# Patient Record
Sex: Male | Born: 2000 | Race: White | Hispanic: No | Marital: Single | Smoking: Never smoker
Health system: Southern US, Community
[De-identification: ages and names within clinical notes are randomized; demographics above are authoritative.]

## PROBLEM LIST (undated history)

## (undated) DIAGNOSIS — F419 Anxiety disorder, unspecified: Secondary | ICD-10-CM

## (undated) DIAGNOSIS — J050 Acute obstructive laryngitis [croup]: Secondary | ICD-10-CM

## (undated) DIAGNOSIS — G43909 Migraine, unspecified, not intractable, without status migrainosus: Secondary | ICD-10-CM

## (undated) DIAGNOSIS — J45909 Unspecified asthma, uncomplicated: Secondary | ICD-10-CM

## (undated) HISTORY — DX: Unspecified asthma, uncomplicated: J45.909

## (undated) HISTORY — DX: Migraine, unspecified, not intractable, without status migrainosus: G43.909

## (undated) HISTORY — DX: Acute obstructive laryngitis (croup): J05.0

---
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
## 2020-06-07 IMAGING — DX DG LUMBAR SPINE COMPLETE 4+V
5 series · 5 of 5 positions shown · non-contrast
Comparison: None.

CLINICAL DATA: Back pain for 1 year

EXAM:
LUMBAR SPINE - COMPLETE 4+ VIEW

[l-spine ap]
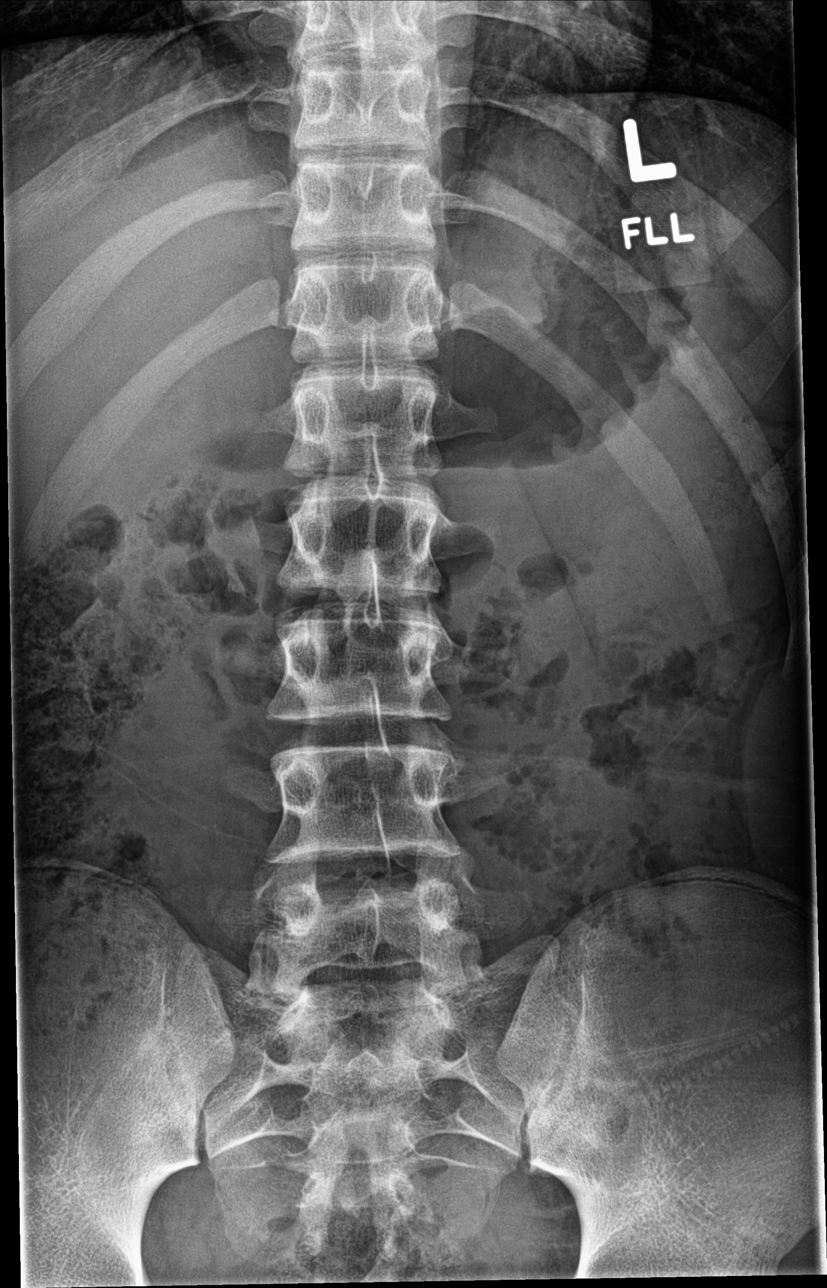

[l-spine obl (1 of 2)]
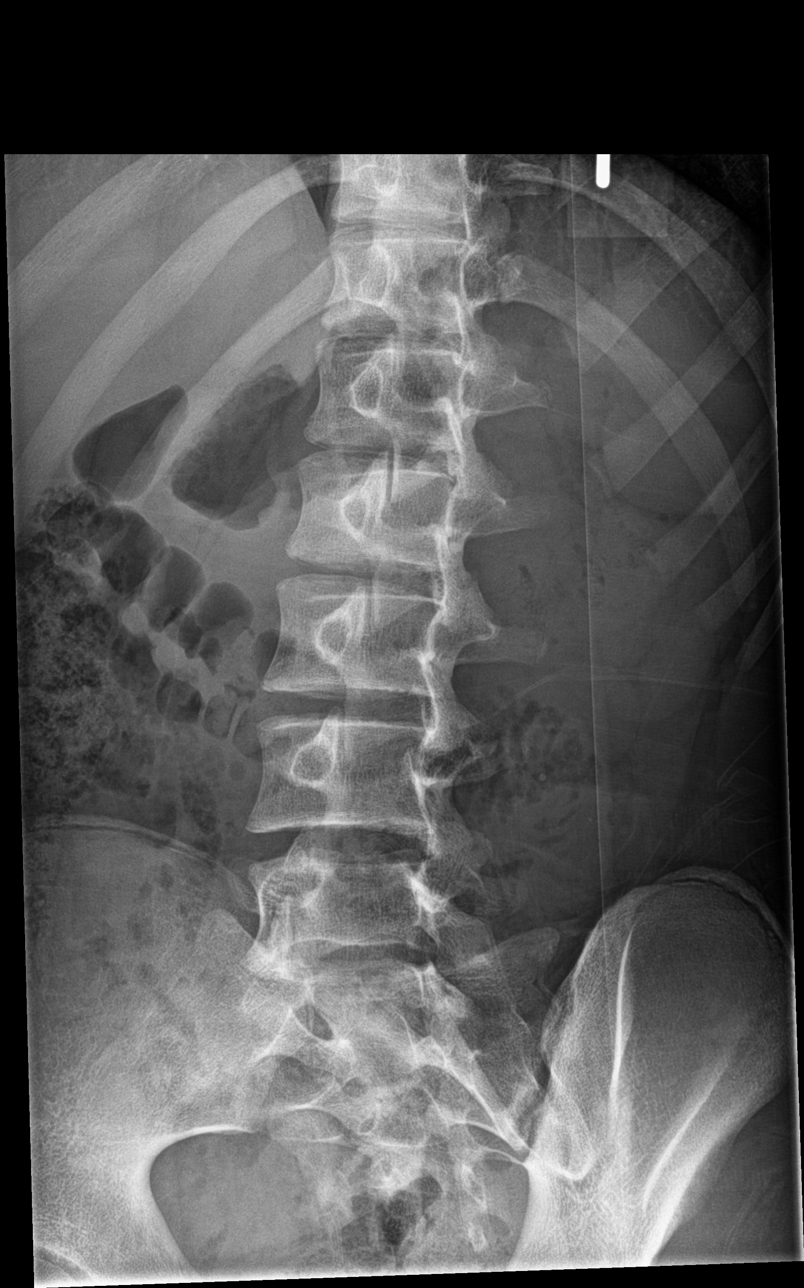

[l-spine obl (2 of 2)]
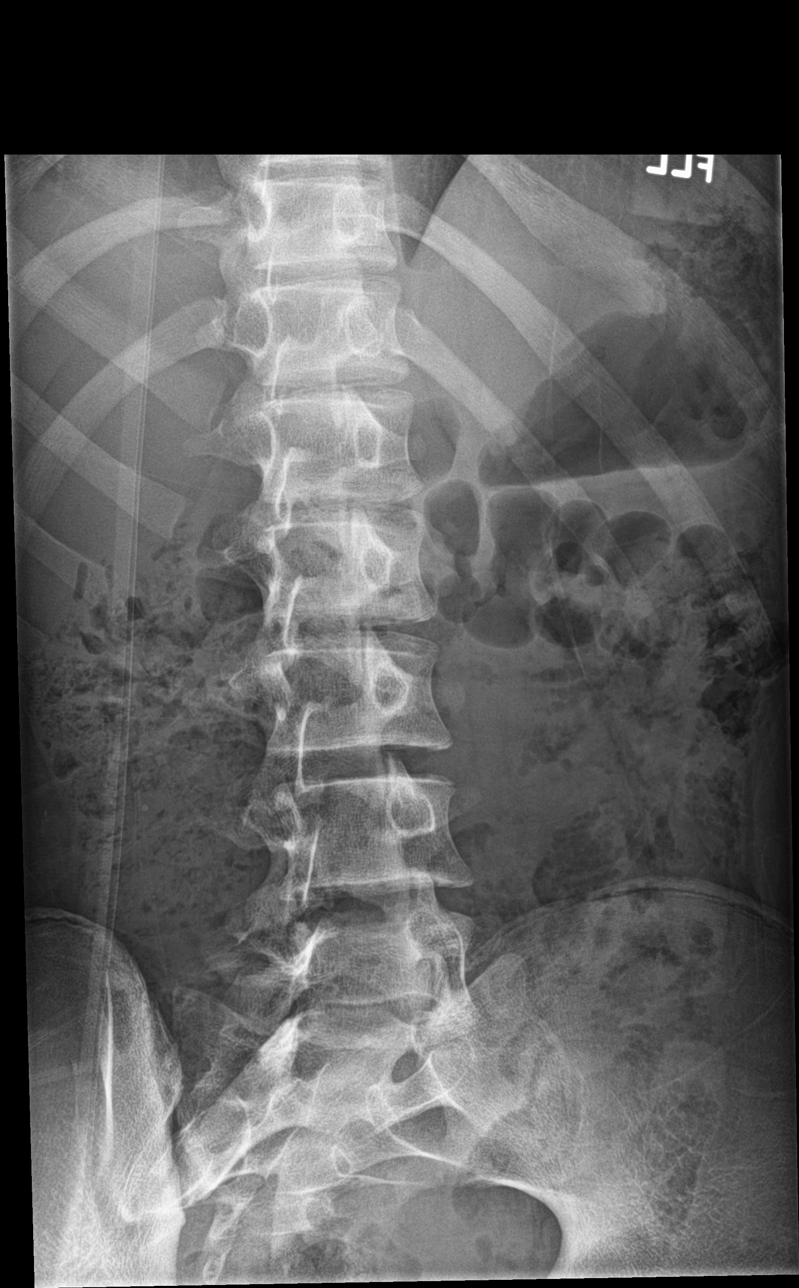

[l-spine lat]
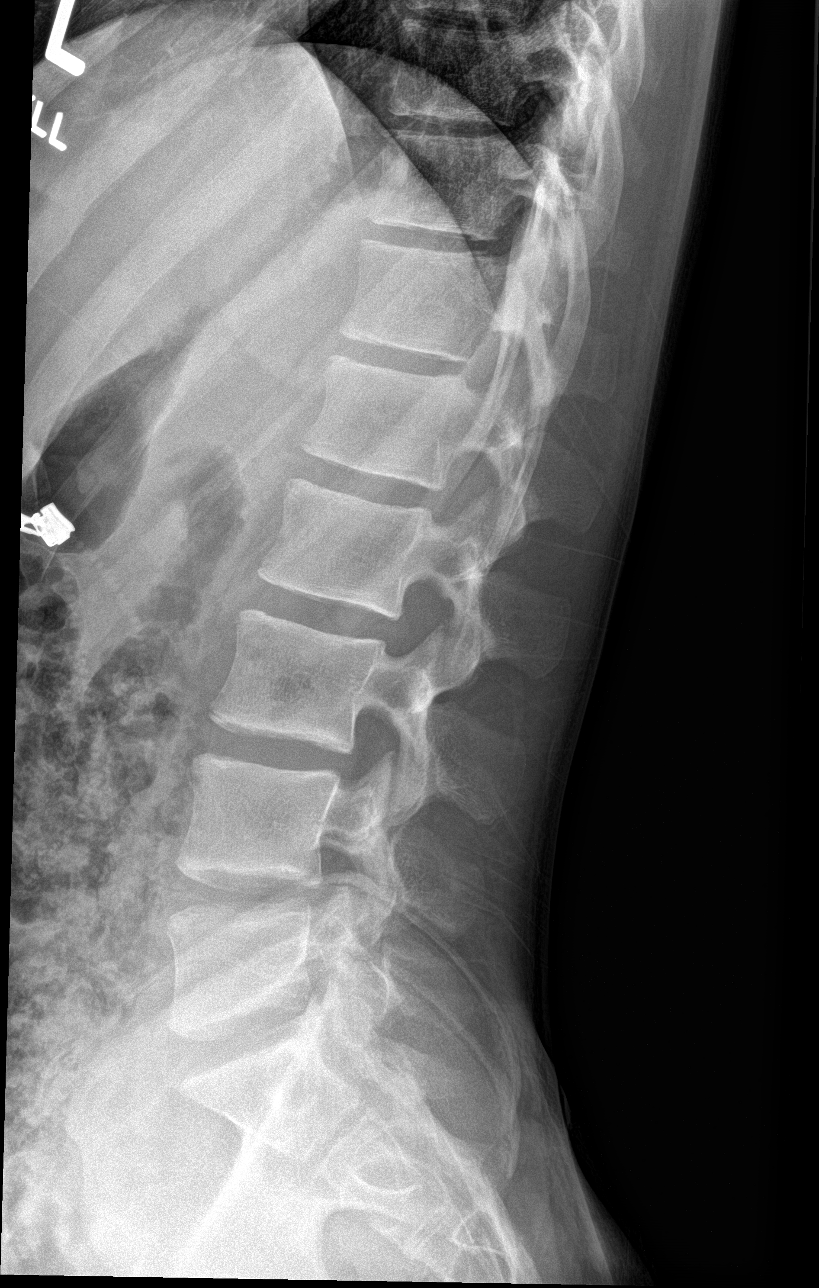

[l-spine spot]
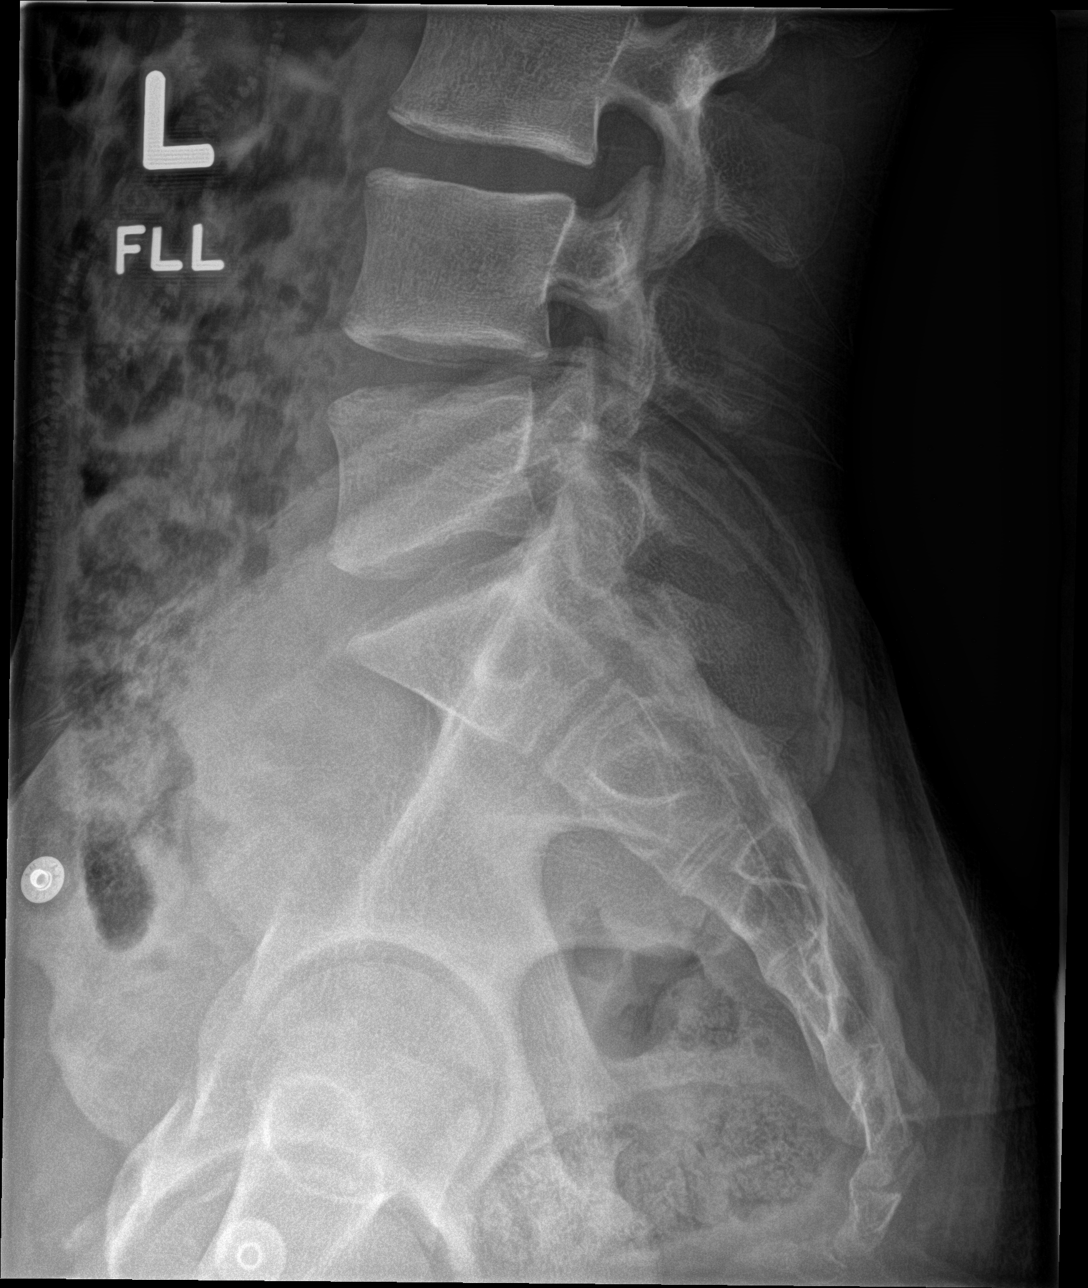

[5 of 5 positions shown; findings below may reference images not displayed]

FINDINGS: There is no evidence of lumbar spine fracture. Alignment is normal.
Intervertebral disc spaces are maintained.
IMPRESSION: Negative.

## 2020-07-30 ENCOUNTER — Ambulatory Visit: Payer: Self-pay | Admitting: Family Medicine

## 2020-07-30 ENCOUNTER — Other Ambulatory Visit: Payer: Self-pay

## 2020-07-30 ENCOUNTER — Ambulatory Visit (INDEPENDENT_AMBULATORY_CARE_PROVIDER_SITE_OTHER): Payer: BC Managed Care – PPO | Admitting: Family Medicine

## 2020-07-30 ENCOUNTER — Encounter: Payer: Self-pay | Admitting: Family Medicine

## 2020-07-30 VITALS — BP 110/72 | HR 72 | Temp 97.2°F | Ht 67.0 in | Wt 146.0 lb

## 2020-07-30 DIAGNOSIS — R41840 Attention and concentration deficit: Secondary | ICD-10-CM

## 2020-07-30 DIAGNOSIS — R062 Wheezing: Secondary | ICD-10-CM | POA: Diagnosis not present

## 2020-07-30 MED ORDER — ALBUTEROL SULFATE HFA 108 (90 BASE) MCG/ACT IN AERS: 2.0000 | INHALATION_SPRAY | Freq: Four times a day (QID) | RESPIRATORY_TRACT | 0 refills | Status: AC | PRN

## 2020-07-30 NOTE — Progress Notes (Signed)
Patient ID: Jon Mclaughlin, male    DOB: [birth year], 19 y.o.   MRN: [id number]   Chief Complaint  Patient presents with  . focus issues   Subjective:    HPI  pt wants to discuss focusing issues. Pt is a sophomore at [employer].  Pt having concern of focusing in college starting in 8/21. Grades are well, not as good.  As and Bs.  [employer]- [employer]- studying Lobbyist.  Never had issues as a child with Add or adhd.  Not working at this time.  Problems with sleep occasionally. No concern of depression or anxiety. 1-2 x per week.  Turning in work on time.  Has procrastination at times.  Medical History Jon Mclaughlin has a past medical history of Asthma, Migraine headache, and Recurrent allergic croup.   Outpatient Encounter Medications as of 07/30/2020  Medication Sig  . albuterol (VENTOLIN HFA) 108 (90 Base) MCG/ACT inhaler Inhale 2 puffs into the lungs every 6 (six) hours as needed for wheezing or shortness of breath.  . [DISCONTINUED] doxycycline (VIBRA-TABS) 100 MG tablet Take 1 tablet (100 mg total) by mouth 2 (two) times daily.   No facility-administered encounter medications on file as of 07/30/2020.     Review of Systems  Constitutional: Negative for chills and fever.  HENT: Negative for congestion, rhinorrhea and sore throat.   Respiratory: Negative for cough, shortness of breath and wheezing.   Cardiovascular: Negative for chest pain and leg swelling.  Gastrointestinal: Negative for abdominal pain, diarrhea, nausea and vomiting.  Genitourinary: Negative for dysuria and frequency.  Skin: Negative for rash.  Neurological: Negative for dizziness, weakness and headaches.  Psychiatric/Behavioral: Positive for decreased concentration. Negative for agitation, dysphoric mood, self-injury, sleep disturbance and suicidal ideas. The patient is not nervous/anxious and is not hyperactive.      Vitals BP 110/72   Pulse 72   Temp (!) 97.2 F (36.2 C)   Ht 5\' 7"  (1.702 m)   Wt 146 lb  (66.2 kg)   SpO2 100%   BMI 22.87 kg/m   Objective:   Physical Exam Vitals and nursing note reviewed.  Constitutional:      General: He is not in acute distress.    Appearance: Normal appearance. He is not ill-appearing.  HENT:     Head: Normocephalic.     Nose: Nose normal. No congestion.     Mouth/Throat:     Mouth: Mucous membranes are moist.     Pharynx: No oropharyngeal exudate.  Eyes:     Extraocular Movements: Extraocular movements intact.     Conjunctiva/sclera: Conjunctivae normal.     Pupils: Pupils are equal, round, and reactive to light.  Cardiovascular:     Rate and Rhythm: Normal rate and regular rhythm.     Pulses: Normal pulses.     Heart sounds: Normal heart sounds. No murmur heard.   Pulmonary:     Effort: Pulmonary effort is normal.     Breath sounds: Normal breath sounds. No wheezing, rhonchi or rales.  Musculoskeletal:        General: Normal range of motion.     Right lower leg: No edema.     Left lower leg: No edema.  Skin:    General: Skin is warm and dry.     Findings: No rash.  Neurological:     General: No focal deficit present.     Mental Status: He is alert and oriented to person, place, and time.     Cranial Nerves: No  cranial nerve deficit.  Psychiatric:        Mood and Affect: Mood normal.        Behavior: Behavior normal.        Thought Content: Thought content normal.        Judgment: Judgment normal.      Assessment and Plan   1. Attention or concentration deficit - Ambulatory referral to Neuropsychology  2. Wheezing - albuterol (VENTOLIN HFA) 108 (90 Base) MCG/ACT inhaler; Inhale 2 puffs into the lungs every 6 (six) hours as needed for wheezing or shortness of breath.  Dispense: 8 g; Refill: 0   Advising needing further evaluation from neuropsychologist to evaluate add vs anxiety.  Pt in agreement.  Pt requesting albuterol inhaler for seasonal allergies. Lungs clear today.  Feels this happening occ at changes of  seasons.  F/u prn.

## 2020-08-19 ENCOUNTER — Encounter: Payer: Self-pay | Admitting: Family Medicine

## 2020-08-19 ENCOUNTER — Ambulatory Visit (INDEPENDENT_AMBULATORY_CARE_PROVIDER_SITE_OTHER): Payer: BC Managed Care – PPO | Admitting: Family Medicine

## 2020-08-19 VITALS — HR 72 | Temp 98.1°F | Resp 18

## 2020-08-19 DIAGNOSIS — J020 Streptococcal pharyngitis: Secondary | ICD-10-CM | POA: Diagnosis not present

## 2020-08-19 LAB — POCT RAPID STREP A (OFFICE): Rapid Strep A Screen: POSITIVE — AB

## 2020-08-19 MED ORDER — AMOXICILLIN 500 MG PO CAPS: 500.0000 mg | ORAL_CAPSULE | Freq: Two times a day (BID) | ORAL | 0 refills | Status: DC

## 2020-08-19 NOTE — Progress Notes (Signed)
   Patient ID: Jon Mclaughlin, male    DOB: 2001-06-03, 19 y.o.   MRN: [id number]   Chief Complaint  Patient presents with  . Fever    Congestion, headaches, body aches and sore throat for 3 days   Subjective:  2 siblings in house also sick with similar illness CC: congestion, headache, sore throat HPI: This is a new problem.  Presents today for an acute visit with a complaint of congestion, headache, body aches, and sore throat.  Symptoms started 3 days ago.  Denies any known Covid exposure.  T-max 100.0.  Reports abdominal pain with some nausea.  Able to eat and drink okay hydration adequate.  Medical History Knight has a past medical history of Asthma, Migraine headache, and Recurrent allergic croup.   Outpatient Encounter Medications as of 08/19/2020  Medication Sig  . albuterol (VENTOLIN HFA) 108 (90 Base) MCG/ACT inhaler Inhale 2 puffs into the lungs every 6 (six) hours as needed for wheezing or shortness of breath.  Marland Kitchen amoxicillin (AMOXIL) 500 MG capsule Take 1 capsule (500 mg total) by mouth 2 (two) times daily.   No facility-administered encounter medications on file as of 08/19/2020.     Review of Systems  Constitutional: Positive for fever. Negative for chills.  HENT: Positive for congestion and sore throat. Negative for ear pain.   Respiratory: Negative for shortness of breath.   Cardiovascular: Negative for chest pain.  Gastrointestinal: Positive for abdominal pain and nausea.  Neurological: Positive for headaches.     Vitals Pulse 72   Temp 98.1 F (36.7 C)   Resp 18   SpO2 98%   Objective:   Physical Exam Vitals reviewed.  Constitutional:      General: He is not in acute distress.    Appearance: He is ill-appearing.  HENT:     Right Ear: Tympanic membrane normal.     Left Ear: Tympanic membrane normal.     Mouth/Throat:     Mouth: Mucous membranes are moist.     Pharynx: Posterior oropharyngeal erythema present. No uvula swelling.     Tonsils:  Tonsillar exudate present. 2+ on the right. 2+ on the left.  Cardiovascular:     Rate and Rhythm: Normal rate and regular rhythm.     Heart sounds: Normal heart sounds.  Pulmonary:     Effort: Pulmonary effort is normal.     Breath sounds: Normal breath sounds.  Abdominal:     General: Bowel sounds are normal.  Skin:    General: Skin is warm and dry.  Neurological:     General: No focal deficit present.     Mental Status: He is alert.  Psychiatric:        Behavior: Behavior normal.      Assessment and Plan   1. Strep sore throat - amoxicillin (AMOXIL) 500 MG capsule; Take 1 capsule (500 mg total) by mouth 2 (two) times daily.  Dispense: 20 capsule; Refill: 0 - POCT rapid strep A - Novel Coronavirus, NAA ([facility])   Rapid strep positive, will treat with amoxicillin for 10 days.  Covid test pending, will notify once results are available.  Recommend supportive therapy, use with OTC medications for other symptom management.  Agrees with plan of care discussed today. Understands warning signs to seek further care: Chest pain, shortness of breath, any significant change in health. Understands to follow-up if symptoms do not improve, or worsen.  Dorena Bodo, FNP-C

## 2020-08-21 LAB — SARS-COV-2, NAA 2 DAY TAT

## 2020-08-21 LAB — NOVEL CORONAVIRUS, NAA: SARS-CoV-2, NAA: NOT DETECTED

## 2020-08-21 LAB — SPECIMEN STATUS REPORT

## 2021-02-12 ENCOUNTER — Other Ambulatory Visit: Payer: Self-pay

## 2021-02-12 ENCOUNTER — Ambulatory Visit (INDEPENDENT_AMBULATORY_CARE_PROVIDER_SITE_OTHER): Payer: BC Managed Care – PPO | Admitting: Family Medicine

## 2021-02-12 ENCOUNTER — Encounter: Payer: Self-pay | Admitting: Family Medicine

## 2021-02-12 VITALS — HR 94 | Temp 98.2°F | Ht 67.0 in | Wt 147.0 lb

## 2021-02-12 DIAGNOSIS — R059 Cough, unspecified: Secondary | ICD-10-CM | POA: Diagnosis not present

## 2021-02-12 DIAGNOSIS — J302 Other seasonal allergic rhinitis: Secondary | ICD-10-CM

## 2021-02-12 DIAGNOSIS — J4521 Mild intermittent asthma with (acute) exacerbation: Secondary | ICD-10-CM | POA: Diagnosis not present

## 2021-02-12 MED ORDER — ALBUTEROL SULFATE (2.5 MG/3ML) 0.083% IN NEBU: 2.5000 mg | INHALATION_SOLUTION | Freq: Four times a day (QID) | RESPIRATORY_TRACT | 0 refills | Status: DC | PRN

## 2021-02-12 MED ORDER — PREDNISONE 20 MG PO TABS: 20.0000 mg | ORAL_TABLET | Freq: Every day | ORAL | 0 refills | Status: DC

## 2021-02-12 MED ORDER — PSEUDOEPH-BROMPHEN-DM 30-2-10 MG/5ML PO SYRP: 10.0000 mL | ORAL_SOLUTION | Freq: Four times a day (QID) | ORAL | 0 refills | Status: DC | PRN

## 2021-02-12 NOTE — Patient Instructions (Addendum)
Take prednisone for 5 days, with food.  Use bromophed cough syrup as needed.  Albuterol as needed for coughing or wheezing.  Call office if not improving by Monday.

## 2021-02-12 NOTE — Progress Notes (Signed)
Patient ID: Jon Mclaughlin, male    DOB: 2000-09-07, 20 y.o.   MRN: [id number]   Chief Complaint  Patient presents with   URI    Subjective:    HPI CC- cough, congestion off and on for about 3 months. Has not had a covid test since symptoms started. Tried otc cough med  Had the flu for a few days about 1.5 mo ago. Pt has h/o asthma and uses inhaler occasionally.  In 12/21- had strep throat.  Didn't get antiviral for flu. When he had this about 2 months go, pt stating he was out of window for tx. Used inhalers at that time.  No fevers now.  Coughing with congestion last week.  Then goes away for a few weeks, then returns for 1 wk.  Taking zyrtec daily.  No new sick contacts now. At home for summer. Family not sick.  No covid testing in last 2 wks.  Using albuterol 1x per day over past week.  ----Not needed today or yesterday.  Occ some mucous.  Nose- not stuffy or runny.  No ear or throat pain.   Medical History Jon Mclaughlin has a past medical history of Asthma, Migraine headache, and Recurrent allergic croup.   Outpatient Encounter Medications as of 02/12/2021  Medication Sig   albuterol (PROVENTIL) (2.5 MG/3ML) 0.083% nebulizer solution Take 3 mLs (2.5 mg total) by nebulization every 6 (six) hours as needed for wheezing or shortness of breath.   albuterol (VENTOLIN HFA) 108 (90 Base) MCG/ACT inhaler Inhale 2 puffs into the lungs every 6 (six) hours as needed for wheezing or shortness of breath.   brompheniramine-pseudoephedrine-DM 30-2-10 MG/5ML syrup Take 10 mLs by mouth 4 (four) times daily as needed (coughing).   predniSONE (DELTASONE) 20 MG tablet Take 1 tablet (20 mg total) by mouth daily with breakfast.   [DISCONTINUED] amoxicillin (AMOXIL) 500 MG capsule Take 1 capsule (500 mg total) by mouth 2 (two) times daily.   No facility-administered encounter medications on file as of 02/12/2021.     Review of Systems  Constitutional:  Negative for chills and fever.  HENT:   Positive for congestion. Negative for ear pain, rhinorrhea, sinus pressure, sinus pain, sneezing and sore throat.   Eyes:  Negative for pain, discharge and itching.  Respiratory:  Positive for cough and wheezing.   Gastrointestinal:  Negative for diarrhea, nausea and vomiting.  Skin:  Negative for rash.  Neurological:  Negative for headaches.    Vitals Pulse 94   Temp 98.2 F (36.8 C) (Temporal)   Ht 5\' 7"  (1.702 m)   Wt 147 lb (66.7 kg)   SpO2 98%   BMI 23.02 kg/m  98.2 F on recheck. Objective:   Physical Exam Vitals reviewed.  Constitutional:      General: He is not in acute distress.    Appearance: Normal appearance. He is not ill-appearing.  HENT:     Head: Normocephalic.     Right Ear: Tympanic membrane, ear canal and external ear normal.     Left Ear: Tympanic membrane, ear canal and external ear normal.     Nose: Nose normal. No congestion.     Mouth/Throat:     Mouth: Mucous membranes are moist.     Pharynx: No oropharyngeal exudate.  Eyes:     Extraocular Movements: Extraocular movements intact.     Conjunctiva/sclera: Conjunctivae normal.     Pupils: Pupils are equal, round, and reactive to light.  Cardiovascular:     Rate and  Rhythm: Normal rate and regular rhythm.     Pulses: Normal pulses.     Heart sounds: Normal heart sounds. No murmur heard. Pulmonary:     Effort: Pulmonary effort is normal. No respiratory distress.     Breath sounds: Normal breath sounds. No wheezing, rhonchi or rales.  Musculoskeletal:        General: Normal range of motion.     Right lower leg: No edema.     Left lower leg: No edema.  Skin:    General: Skin is warm and dry.     Findings: No rash.  Neurological:     General: No focal deficit present.     Mental Status: He is alert and oriented to person, place, and time.     Cranial Nerves: No cranial nerve deficit.  Psychiatric:        Mood and Affect: Mood normal.        Behavior: Behavior normal.     Assessment and  Plan   1. Mild intermittent asthma with exacerbation - brompheniramine-pseudoephedrine-DM 30-2-10 MG/5ML syrup; Take 10 mLs by mouth 4 (four) times daily as needed (coughing).  Dispense: 120 mL; Refill: 0 - albuterol (PROVENTIL) (2.5 MG/3ML) 0.083% nebulizer solution; Take 3 mLs (2.5 mg total) by nebulization every 6 (six) hours as needed for wheezing or shortness of breath.  Dispense: 25 mL; Refill: 0 - predniSONE (DELTASONE) 20 MG tablet; Take 1 tablet (20 mg total) by mouth daily with breakfast.  Dispense: 10 tablet; Refill: 0  2. Cough - Novel Coronavirus, NAA ([facility])  3. Seasonal allergies   Recommending symptomatic treatment for viral URI and mild asthma exacerbation.  Continue nebulizer, take prednisone until finished, and gave Bromfed for coughing as needed.  COVID testing is pending.  Patient to quarantine until results are back.  Patient to call if worsening symptoms in the next 2 to 3 days or if not improving.  Patient agrees with plan  Return if symptoms worsen or fail to improve.   02/12/2021

## 2021-02-13 LAB — SARS-COV-2, NAA 2 DAY TAT

## 2021-02-13 LAB — NOVEL CORONAVIRUS, NAA: SARS-CoV-2, NAA: NOT DETECTED

## 2021-03-04 ENCOUNTER — Encounter: Payer: Self-pay | Admitting: Family Medicine

## 2021-05-15 ENCOUNTER — Ambulatory Visit (INDEPENDENT_AMBULATORY_CARE_PROVIDER_SITE_OTHER): Payer: Self-pay | Admitting: Clinical

## 2021-05-15 ENCOUNTER — Other Ambulatory Visit: Payer: Self-pay

## 2021-05-15 DIAGNOSIS — F321 Major depressive disorder, single episode, moderate: Secondary | ICD-10-CM

## 2021-05-15 DIAGNOSIS — F411 Generalized anxiety disorder: Secondary | ICD-10-CM

## 2021-05-15 NOTE — Progress Notes (Signed)
Virtual Visit via Telephone Note  I connected with Jon Mclaughlin on 05/15/21 at  9:00 AM EDT by telephone and verified that I am speaking with the correct person using two identifiers.  Location: Patient: Home Provider: Office   I discussed the limitations, risks, security and privacy concerns of performing an evaluation and management service by telephone and the availability of in person appointments. I also discussed with the patient that there may be a patient responsible charge related to this service. The patient expressed understanding and agreed to proceed.     Comprehensive Clinical Assessment (CCA) Note  05/15/2021 Jon Mclaughlin [id number]  Chief Complaint: Depression and GAD Visit Diagnosis: Depression and GAD   CCA Screening, Triage and Referral (STR)  Patient Reported Information How did you hear about [place]? No data recorded Referral name: No data recorded Referral phone number: No data recorded  Whom do you see for routine medical problems? No data recorded Practice/Facility Name: No data recorded Practice/Facility Phone Number: No data recorded Name of Contact: No data recorded Contact Number: No data recorded Contact Fax Number: No data recorded Prescriber Name: No data recorded Prescriber Address (if known): No data recorded  What Is the Reason for Your Visit/Call Today? No data recorded How Long Has This Been Causing You Problems? No data recorded What Do You Feel Would Help You the Most Today? No data recorded  Have You Recently Been in Any Inpatient Treatment (Hospital/Detox/Crisis Center/28-Day Program)? No data recorded Name/Location of Program/Hospital:No data recorded How Long Were You There? No data recorded When Were You Discharged? No data recorded  Have You Ever Received Services From [facility] Before? No data recorded Who Do You See at [facility]? No data recorded  Have You Recently Had Any Thoughts About Hurting Yourself? No data  recorded Are You Planning to Commit Suicide/Harm Yourself At This time? No data recorded  Have you Recently Had Thoughts About Hurting Someone Jon Mclaughlin? No data recorded Explanation: No data recorded  Have You Used Any Alcohol or Drugs in the Past 24 Hours? No data recorded How Long Ago Did You Use Drugs or Alcohol? No data recorded What Did You Use and How Much? No data recorded  Do You Currently Have a Therapist/Psychiatrist? No data recorded Name of Therapist/Psychiatrist: No data recorded  Have You Been Recently Discharged From Any Office Practice or Programs? No data recorded Explanation of Discharge From Practice/Program: No data recorded    CCA Screening Triage Referral Assessment Type of Contact: No data recorded Is this Initial or Reassessment? No data recorded Date Telepsych consult ordered in [facility]:  No data recorded Time Telepsych consult ordered in [facility]:  No data recorded  Patient Reported Information Reviewed? No data recorded Patient Left Without Being Seen? No data recorded Reason for Not Completing Assessment: No data recorded  Collateral Involvement: No data recorded  Does Patient Have a Court Appointed Legal Guardian? No data recorded Name and Contact of Legal Guardian: No data recorded If Minor and Not Living with Parent(s), Who has Custody? No data recorded Is CPS involved or ever been involved? No data recorded Is APS involved or ever been involved? No data recorded  Patient Determined To Be At Risk for Harm To Self or Others Based on Review of Patient Reported Information or Presenting Complaint? No data recorded Method: No data recorded Availability of Means: No data recorded Intent: No data recorded Notification Required: No data recorded Additional Information for Danger to Others Potential: No data recorded Additional  Comments for Danger to Others Potential: No data recorded Are There Guns or Other Weapons in Your Home? No data recorded Types of  Guns/Weapons: No data recorded Are These Weapons Safely Secured?                            No data recorded Who Could Verify You Are Able To Have These Secured: No data recorded Do You Have any Outstanding Charges, Pending Court Dates, Parole/Probation? No data recorded Contacted To Inform of Risk of Harm To Self or Others: No data recorded  Location of Assessment: No data recorded  Does Patient Present under Involuntary Commitment? No data recorded IVC Papers Initial File Date: No data recorded  [place] of Residence: No data recorded  Patient Currently Receiving the Following Services: No data recorded  Determination of Need: No data recorded  Options For Referral: No data recorded    CCA Biopsychosocial Intake/Chief Complaint:  The patient was referred by his PCP due to difficulty with Anxiousness  Current Symptoms/Problems: Anxiety and mood difficulty   Patient Reported Schizophrenia/Schizoaffective Diagnosis in Past: No   Strengths: School work and making friends  Preferences: Hangout with friends, Arts development officer, Holiday representative, and Play video games  Abilities: Workout, Clinical cytogeneticist Games,   Type of Services Patient Feels are Needed: Medication Management and Individual therapy   Initial Clinical Notes/Concerns: None   Mental Health Symptoms Depression:   Difficulty Concentrating; Sleep (too much or little); Change in energy/activity; Irritability; Fatigue   Duration of Depressive symptoms: Greater than two weeks  Mania:  None  Anxiety:    Difficulty concentrating; Fatigue; Restlessness; Tension; Worrying; Sleep; Irritability   Psychosis:   None   Duration of Psychotic symptoms: No data recorded  Trauma:   None   Obsessions:   None   Compulsions:   None   Inattention:   None   Hyperactivity/Impulsivity:   None   Oppositional/Defiant Behaviors:   None   Emotional Irregularity:   None   Other Mood/Personality Symptoms:   NA    Mental Status Exam Appearance  and self-care  Stature:   Average   Weight:   Average weight   Clothing:   Casual   Grooming:   Normal   Cosmetic use:   None   Posture/gait:   Normal   Motor activity:   Not Remarkable   Sensorium  Attention:   Normal   Concentration:   Anxiety interferes   Orientation:   X5   Recall/memory:   Normal (Notes prior concussion 31yrs ago)   Affect and Mood  Affect:   Appropriate   Mood:   Anxious   Relating  Eye contact:   Normal   Facial expression:   Responsive   Attitude toward examiner:   Cooperative   Thought and Language  Speech flow:  Normal   Thought content:   Appropriate to Mood and Circumstances   Preoccupation:   None   Hallucinations:   None   Organization:  Logical  Company secretary of Knowledge:   Good   Intelligence:   Average   Abstraction:   Normal   Judgement:   Good   Reality Testing:   Realistic   Insight:   Good   Decision Making:   Normal   Social Functioning  Social Maturity:   Responsible   Social Judgement:   Normal   Stress  Stressors:   School (Difficulty with worrying notes this is during school)  Coping Ability:   Normal   Skill Deficits:   None   Supports:   Family; Friends/Service system     Religion: Religion/Spirituality Are You A Religious Person?: No  Leisure/Recreation: Leisure / Recreation Do You Have Hobbies?: Yes Leisure and Hobbies: Technical brewer  Exercise/Diet: Exercise/Diet Do You Exercise?: Yes What Type of Exercise Do You Do?: Weight Training How Many Times a Week Do You Exercise?: 4-5 times a week Have You Gained or Lost A Significant Amount of Weight in the Past Six Months?: No Do You Follow a Special Diet?: No Do You Have Any Trouble Sleeping?: Yes Explanation of Sleeping Difficulties: The patient notes difficulty falling asleep   CCA Employment/Education Employment/Work Situation: Employment / Work Situation Employment Situation:  Surveyor, minerals Job has Been Impacted by Current Illness: No What is the Longest Time Patient has Held a Job?: NA Where was the Patient Employed at that Time?: NA Has Patient ever Been in the 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 Did You Have Any Scientist, research (life sciences) In School?: [employer] Did You Have An Individualized Education Program (IIEP): No Did You Have Any Difficulty At School?: No Patient's Education Has Been Impacted by Current Illness: No   CCA Family/Childhood History Family and Relationship History: Family history Marital status: Single Are you sexually active?: No What is your sexual orientation?: Heterosexual Has your sexual activity been affected by drugs, alcohol, medication, or emotional stress?: None Does patient have children?: No  Childhood History:  Childhood History By whom was/is the patient raised?: Both parents Additional childhood history information: No additional Description of patient's relationship with caregiver when they were a child: The patient notes, " I had a good relationship with my parents Patient's description of current relationship with people who raised him/her: The patient  notes, " I have a good relationship with my parents". How were you disciplined when you got in trouble as a child/adolescent?: Things taken away Does patient have siblings?: Yes Number of Siblings: 3 Description of patient's current relationship with siblings: The patient notes, " I have a goood relationship with my brothers currently". Did patient suffer any  verbal/emotional/physical/sexual abuse as a child?: No Did patient suffer from severe childhood neglect?: No Has patient ever been sexually abused/assaulted/raped as an adolescent or adult?: No Was the patient ever a victim of a crime or a disaster?: No Witnessed domestic violence?: No Has patient been affected by domestic violence as an adult?: No  Child/Adolescent Assessment:     CCA Substance Use Alcohol/Drug Use: Alcohol / Drug Use Pain Medications: None Prescriptions: None Over the Counter: None History of alcohol / drug use?: No history of alcohol / drug abuse Longest period of sobriety (when/how long): NA                         ASAM's:  Six Dimensions of Multidimensional Assessment  Dimension 1:  Acute Intoxication and/or Withdrawal Potential:      Dimension 2:  Biomedical Conditions and Complications:      Dimension 3:  Emotional, Behavioral, or Cognitive Conditions and Complications:     Dimension 4:  Readiness to Change:  Dimension 5:  Relapse, Continued use, or Continued Problem Potential:     Dimension 6:  Recovery/Living Environment:     ASAM Severity Score:    ASAM Recommended Level of Treatment:     Substance use Disorder (SUD)    Recommendations for Services/Supports/Treatments: Recommendations for Services/Supports/Treatments Recommendations For Services/Supports/Treatments: Individual Therapy, Medication Management  DSM5 Diagnoses: Patient Active Problem List   Diagnosis Date Noted   Strep sore throat 08/19/2020   Chronic lower back pain 11/02/2018   Migraine headache 09/09/2013   Asthma with acute exacerbation 07/17/2013    Patient Centered Plan: Patient is on the following Treatment Plan(s):  Depression and GAD   Referrals to Alternative Service(s): Referred to Alternative Service(s):   Place:   Date:   Time:    Referred to Alternative Service(s):   Place:   Date:   Time:    Referred to Alternative Service(s):   Place:    Date:   Time:    Referred to Alternative Service(s):   Place:   Date:   Time:     I discussed the assessment and treatment plan with the patient. The patient was provided an opportunity to ask questions and all were answered. The patient agreed with the plan and demonstrated an understanding of the instructions.   The patient was advised to call back or seek an in-person evaluation if the symptoms worsen or if the condition fails to improve as anticipated.  I provided 60 minutes of non-face-to-face time during this encounter.  Winfred Burnerry T Hanaan Gancarz, LCSW  05/15/2021

## 2021-05-15 NOTE — Plan of Care (Signed)
Verbal Consent 

## 2021-05-20 ENCOUNTER — Ambulatory Visit (INDEPENDENT_AMBULATORY_CARE_PROVIDER_SITE_OTHER): Payer: BC Managed Care – PPO | Admitting: Family Medicine

## 2021-05-20 ENCOUNTER — Other Ambulatory Visit: Payer: Self-pay

## 2021-05-20 VITALS — BP 130/84 | HR 63 | Ht 67.0 in | Wt 144.4 lb

## 2021-05-20 DIAGNOSIS — F410 Panic disorder [episodic paroxysmal anxiety] without agoraphobia: Secondary | ICD-10-CM

## 2021-05-20 DIAGNOSIS — F419 Anxiety disorder, unspecified: Secondary | ICD-10-CM

## 2021-05-20 MED ORDER — HYDROXYZINE PAMOATE 25 MG PO CAPS: ORAL_CAPSULE | ORAL | 0 refills | Status: DC

## 2021-05-20 NOTE — Patient Instructions (Signed)
  For [place] Area:   [facility]:  [address];  [place]  [phone] fax # [phone]   [facility]:  [address]  [phone]   [facility] [address]  [phone]  fax # [phone]   [facility]  [phone]  [phone]   [facility] [phone]   For [place] area:  [facility] [place]  [address]  [phone]   [facility]  [address]  In [place]   [phone]   [facility], [address], [place], [place] [address]  [phone]   [facility]  [address]  [place], [place] [address]  [phone]   [facility]  [address]  [place], [place] [address]   

## 2021-05-20 NOTE — Progress Notes (Signed)
Patient ID: Jon Mclaughlin, male    DOB: [birth year], 20 y.o.   MRN: [id number]   Chief Complaint  Patient presents with   discuss anxiety and panic attacks   Subjective:    HPI Pt seen with father today, concern of panic attacks and anxiety.  Pt is away at college, [employer].  Worse in class. Getting up and leave for 30 mins or miss class.  At sleep.  Stop thinking aobu things.  Last few months. -getting arrested, or school shooting. - something bad happen to him.  Away at college at [employer]; Calls home to parents with concerns.   Not seen student health or counselors.  Grades- good, just started.   Not tried meds for this.  Getting bad, and forehead sweating, heart beating hard.  Doing some exercising 1 hr per 5 days per week.  Father wanting pt to come here for this concern instead of student help.  Had phone visit with [place], and has a follow up visit.   Medical History Jon Mclaughlin has a past medical history of Asthma, Migraine headache, and Recurrent allergic croup.   Outpatient Encounter Medications as of 05/20/2021  Medication Sig   hydrOXYzine (VISTARIL) 25 MG capsule Take 1/2 -1 tab p.o. tid prn anxiety   albuterol (VENTOLIN HFA) 108 (90 Base) MCG/ACT inhaler Inhale 2 puffs into the lungs every 6 (six) hours as needed for wheezing or shortness of breath.   [DISCONTINUED] albuterol (PROVENTIL) (2.5 MG/3ML) 0.083% nebulizer solution Take 3 mLs (2.5 mg total) by nebulization every 6 (six) hours as needed for wheezing or shortness of breath.   [DISCONTINUED] brompheniramine-pseudoephedrine-DM 30-2-10 MG/5ML syrup Take 10 mLs by mouth 4 (four) times daily as needed (coughing).   [DISCONTINUED] predniSONE (DELTASONE) 20 MG tablet Take 1 tablet (20 mg total) by mouth daily with breakfast.   No facility-administered encounter medications on file as of 05/20/2021.     Review of Systems  Constitutional:  Negative for chills and fever.  HENT:  Negative for congestion,  rhinorrhea and sore throat.   Respiratory:  Negative for cough, shortness of breath and wheezing.   Cardiovascular:  Negative for chest pain and leg swelling.  Gastrointestinal:  Negative for abdominal pain, diarrhea, nausea and vomiting.  Genitourinary:  Negative for dysuria and frequency.  Skin:  Negative for rash.  Neurological:  Negative for dizziness, weakness and headaches.  +anxiety, panic attacks.  Vitals BP 130/84   Pulse 63   Ht 5\' 7"  (1.702 m)   Wt 144 lb 6.4 oz (65.5 kg)   BMI 22.62 kg/m   Objective:   Physical Exam Vitals and nursing note reviewed.  Constitutional:      General: He is not in acute distress.    Appearance: Normal appearance. He is not ill-appearing.  HENT:     Head: Normocephalic.     Nose: Nose normal. No congestion.     Mouth/Throat:     Mouth: Mucous membranes are moist.     Pharynx: No oropharyngeal exudate.  Eyes:     Extraocular Movements: Extraocular movements intact.     Conjunctiva/sclera: Conjunctivae normal.     Pupils: Pupils are equal, round, and reactive to light.  Cardiovascular:     Rate and Rhythm: Normal rate and regular rhythm.     Pulses: Normal pulses.     Heart sounds: Normal heart sounds. No murmur heard. Pulmonary:     Effort: Pulmonary effort is normal.     Breath sounds: Normal breath sounds. No  wheezing, rhonchi or rales.  Musculoskeletal:        General: Normal range of motion.     Right lower leg: No edema.     Left lower leg: No edema.  Skin:    General: Skin is warm and dry.     Findings: No rash.  Neurological:     General: No focal deficit present.     Mental Status: He is alert and oriented to person, place, and time.     Cranial Nerves: No cranial nerve deficit.  Psychiatric:        Mood and Affect: Mood normal.        Behavior: Behavior normal.        Thought Content: Thought content normal.        Judgment: Judgment normal.     Assessment and Plan   1. Anxiety - hydrOXYzine (VISTARIL) 25  MG capsule; Take 1/2 -1 tab p.o. tid prn anxiety  Dispense: 90 capsule; Refill: 0 - Ambulatory referral to Psychology  2. Panic attacks - hydrOXYzine (VISTARIL) 25 MG capsule; Take 1/2 -1 tab p.o. tid prn anxiety  Dispense: 90 capsule; Refill: 0 - Ambulatory referral to Psychology   Ordered referral to psychology and also pt to see psychiatrist at student health center if needed.  Pt to cont with counselor he has for now. Try hydoxyzine prn for anxiety. Cont to exercise.  Work on Freight forwarder. Call if worsneing SI or self harm concerns.  Pt voiced understanding. Gave father numbers to all counselor and psychiatry in the area.  [facility] [place] [facility] needed.   Return in about 1 month (around 06/19/2021) for f/u anxiety.

## 2021-06-05 ENCOUNTER — Other Ambulatory Visit: Payer: Self-pay

## 2021-06-05 ENCOUNTER — Ambulatory Visit (HOSPITAL_COMMUNITY): Payer: Self-pay | Admitting: Clinical

## 2021-06-05 ENCOUNTER — Telehealth (HOSPITAL_COMMUNITY): Payer: Self-pay | Admitting: Clinical

## 2021-06-05 NOTE — Telephone Encounter (Signed)
The pt did not respond to video link or phone call, Staff left VM and patient did not respond

## 2021-07-05 ENCOUNTER — Telehealth: Payer: Self-pay | Admitting: Family Medicine

## 2021-07-05 NOTE — Telephone Encounter (Signed)
Nurses  Patient had visit with Dr. Ladona Ridgel back in September for anxiety, Dr. Ladona Ridgel wanted this patient to follow-up with Dr. Adriana Simas and get established.  Please reach out to the patient and have her get established with Dr. Adriana Simas

## 2021-07-06 NOTE — Telephone Encounter (Signed)
Please contact patient to have his est care with Dr.Cook. thank you!

## 2021-07-07 NOTE — Telephone Encounter (Signed)
Left message with patient to schedule appointment with Dr. Adriana Simas

## 2021-12-25 ENCOUNTER — Encounter: Payer: Self-pay | Admitting: Emergency Medicine

## 2021-12-25 ENCOUNTER — Ambulatory Visit
Admission: EM | Admit: 2021-12-25 | Discharge: 2021-12-25 | Disposition: A | Discharge status: Home / Self Care | Payer: 59

## 2021-12-25 DIAGNOSIS — W19XXXA Unspecified fall, initial encounter: Secondary | ICD-10-CM

## 2021-12-25 DIAGNOSIS — S0993XA Unspecified injury of face, initial encounter: Secondary | ICD-10-CM | POA: Diagnosis not present

## 2021-12-25 DIAGNOSIS — R220 Localized swelling, mass and lump, head: Secondary | ICD-10-CM | POA: Diagnosis not present

## 2021-12-25 NOTE — ED Triage Notes (Signed)
Larey Seat off a 6 ft ledge and landed on a metal hand rail.  Pain to left jaw area and some swelling.   ?

## 2021-12-25 NOTE — ED Notes (Signed)
Patient is being discharged from the Urgent Care and sent to the Emergency Department via private vehicle . Per PA, patient is in need of higher level of care due to jaw injury. Patient is aware and verbalizes understanding of plan of care.  ?Vitals:  ? 12/25/21 1722  ?BP: 136/73  ?Pulse: 75  ?Resp: 18  ?Temp: 97.7 ?F (36.5 ?C)  ?SpO2: 98%  ? ? ?

## 2021-12-27 ENCOUNTER — Encounter (HOSPITAL_COMMUNITY): Payer: Self-pay

## 2021-12-27 ENCOUNTER — Emergency Department (HOSPITAL_COMMUNITY)
Admission: EM | Admit: 2021-12-27 | Discharge: 2021-12-27 | Disposition: A | Discharge status: Home / Self Care | Payer: 59 | Attending: Emergency Medicine | Admitting: Emergency Medicine

## 2021-12-27 ENCOUNTER — Emergency Department (HOSPITAL_COMMUNITY): Payer: 59

## 2021-12-27 ENCOUNTER — Other Ambulatory Visit: Payer: Self-pay

## 2021-12-27 DIAGNOSIS — W01198A Fall on same level from slipping, tripping and stumbling with subsequent striking against other object, initial encounter: Secondary | ICD-10-CM | POA: Insufficient documentation

## 2021-12-27 DIAGNOSIS — S02602A Fracture of unspecified part of body of left mandible, initial encounter for closed fracture: Secondary | ICD-10-CM | POA: Diagnosis not present

## 2021-12-27 DIAGNOSIS — S0993XA Unspecified injury of face, initial encounter: Secondary | ICD-10-CM | POA: Diagnosis present

## 2021-12-27 NOTE — ED Provider Notes (Signed)
?[facility] EMERGENCY DEPARTMENT ?Provider Note ? ? ?CSN: [id number] ?Arrival date & time: 12/27/21  1930 ? ?  ? ?History ? ?Chief Complaint  ?Patient presents with  ? Jaw Pain  ? ? ?Jon Mclaughlin is a 21 y.o. male.  He is here with a complaint of left-sided jaw pain after a fall Thursday night when he struck it on a metal railing.  No loss of consciousness.  No intraoral bleeding.  Since then he has had pain on his left jaw and feeling like his teeth do not fit together correctly.  No other injuries or complaints. ? ?The history is provided by the patient.  ?Facial Injury ?Mechanism of injury:  Direct blow ?Injury location: Left jaw. ?Time since incident:  3 days ?Pain details:  ?  Quality:  Dull ?  Timing:  Constant ?  Progression:  Unchanged ?Relieved by:  None tried ?Worsened by:  Pressure ?Ineffective treatments:  None tried ?Associated symptoms: malocclusion   ?Associated symptoms: no altered mental status, no difficulty breathing, no double vision, no epistaxis, no headaches, no loss of consciousness, no neck pain and no trismus   ? ?  ? ?Home Medications ?Prior to Admission medications   ?Medication Sig Start Date End Date Taking? Authorizing Provider  ?albuterol (VENTOLIN HFA) 108 (90 Base) MCG/ACT inhaler Inhale 2 puffs into the lungs every 6 (six) hours as needed for wheezing or shortness of breath. 07/30/20   Annalee Genta, DO  ?hydrOXYzine (VISTARIL) 25 MG capsule Take 1/2 -1 tab p.o. tid prn anxiety 05/20/21   Annalee Genta, DO  ?   ? ?Allergies    ?Patient has no known allergies.   ? ?Review of Systems   ?Review of Systems  ?HENT:  Negative for nosebleeds and trouble swallowing.   ?Eyes:  Negative for double vision and visual disturbance.  ?Respiratory:  Negative for shortness of breath.   ?Musculoskeletal:  Negative for neck pain.  ?Neurological:  Negative for loss of consciousness and headaches.  ? ?Physical Exam ?Updated Vital Signs ?BP 127/77 (BP Location: Right Arm)   Pulse 66   Temp 98 ?F  (36.7 ?C) (Oral)   Resp 20   Ht 5\' 10"  (1.778 m)   Wt 83.9 kg   SpO2 98%   BMI 26.54 kg/m?  ?Physical Exam ?Vitals and nursing note reviewed.  ?Constitutional:   ?   General: He is not in acute distress. ?   Appearance: Normal appearance. He is well-developed.  ?HENT:  ?   Head: Normocephalic and atraumatic.  ?   Mouth/Throat:  ?   Mouth: Mucous membranes are moist.  ?   Pharynx: Oropharynx is clear.  ?   Comments: He has tenderness across to his left jaw.  There is no obvious asymmetry.  He is able to hold a tongue depressor between his molars on both sides ?Eyes:  ?   Conjunctiva/sclera: Conjunctivae normal.  ?Cardiovascular:  ?   Rate and Rhythm: Normal rate and regular rhythm.  ?   Heart sounds: No murmur heard. ?Pulmonary:  ?   Effort: Pulmonary effort is normal. No respiratory distress.  ?   Breath sounds: Normal breath sounds.  ?Abdominal:  ?   Palpations: Abdomen is soft.  ?   Tenderness: There is no abdominal tenderness.  ?Musculoskeletal:     ?   General: No swelling.  ?   Cervical back: Neck supple.  ?Skin: ?   General: Skin is warm and dry.  ?   Capillary  Refill: Capillary refill takes less than 2 seconds.  ?Neurological:  ?   General: No focal deficit present.  ?   Mental Status: He is alert.  ? ? ?ED Results / Procedures / Treatments   ?Labs ?(all labs ordered are listed, but only abnormal results are displayed) ?Labs Reviewed - No data to display ? ?EKG ?None ? ?Radiology ?CT Maxillofacial WO CM ? ?Result Date: 12/27/2021 ?CLINICAL DATA:  Facial trauma EXAM: CT MAXILLOFACIAL WITHOUT CONTRAST TECHNIQUE: Multidetector CT imaging of the maxillofacial structures was performed. Multiplanar CT image reconstructions were also generated. RADIATION DOSE REDUCTION: This exam was performed according to the departmental dose-optimization program which includes automated exposure control, adjustment of the mA and/or kV according to patient size and/or use of iterative reconstruction technique. COMPARISON:   None. FINDINGS: Osseous: Mastoid air cells are clear. Mandibular heads are normally position. Acute nondisplaced fracture involving the left mandibular angle, fracture extends through the alveolar ridge/tooth socket of the left third molar. No contralateral right mandibular fracture is seen. Pterygoid plates and zygomatic arches are intact. No nasal bone fracture Orbits: Negative. No traumatic or inflammatory finding. Sinuses: Clear. Soft tissues: Edema and soft tissue swelling involving the left facial soft tissues and overlying the mandible. Limited intracranial: No significant or unexpected finding. IMPRESSION: Acute nondisplaced fracture involving the left mandibular angle that extends through the tooth socket of the left third molar tooth. Electronically Signed   By: Jasmine Pang M.D.   On: 12/27/2021 21:19   ? ?Procedures ?Procedures  ? ? ?Medications Ordered in ED ?Medications - No data to display ? ?ED Course/ Medical Decision Making/ A&P ?Clinical Course as of 12/28/21 1043  ?Wynelle Link Dec 27, 2021  ?2128 Discussed with Dr. Arita Miss ENT.  He recommends soft diet and follow-up in the office this week for a decision if he needs surgery or not. [MB]  ?2131 Reviewed with patient.  He is comfortable plan for outpatient follow-up soft diet. [MB]  ?  ?Clinical Course User Index ?[MB] Terrilee Files, MD  ? ?                        ?Medical Decision Making ?Amount and/or Complexity of Data Reviewed ?Radiology: ordered. ? ?This patient complains of left-sided jaw pain; this involves an extensive number of treatment ?Options and is a complaint that carries with it a high risk of complications and ?morbidity. The differential includes fracture, dislocation, subluxation, contusion ? ?I ordered imaging studies which included CT max face and I independently ?   visualized and interpreted imaging which showed mandibular fracture ?Previous records obtained and reviewed in epic no recent admissions ?I consulted Dr. Arita Miss plastic  surgery and discussed lab and imaging findings and discussed disposition.  ? ?After the interventions stated above, I reevaluated the patient and found patient to be hemodynamically stable ?Admission and further testing considered, no indications for admission or further work-up at this time.  Patient in agreement with plan for outpatient follow-up with Dr. Arita Miss.  Return instructions discussed ? ? ? ? ? ? ? ? ? ?Final Clinical Impression(s) / ED Diagnoses ?Final diagnoses:  ?Closed fracture of left side of mandibular body, initial encounter (HCC)  ? ? ?Rx / DC Orders ?ED Discharge Orders   ? ? None  ? ?  ? ? ?  ?Terrilee Files, MD ?12/28/21 1044 ? ?

## 2021-12-27 NOTE — ED Triage Notes (Signed)
Pt states he fell Thursday night hitting the left side of his face on a metal handrail. Pt states he thinks his jaw may be dislocated. Pts left side of face is swollen and he states his teeth no longer line up and is not able to fully open his mouth.  ?

## 2021-12-27 NOTE — ED Notes (Signed)
Patient transported to CT 

## 2021-12-27 NOTE — Discharge Instructions (Signed)
You were seen in the emergency department for left-sided jaw pain.  You had a CAT scan that showed you have a mandible or jaw fracture.  You can use Tylenol and ibuprofen for pain.  Ice to the affected area.  Soft diet.  Contact Dr. Thomos Lemons office for close follow-up. ?

## 2021-12-28 ENCOUNTER — Other Ambulatory Visit: Payer: Self-pay | Admitting: Plastic Surgery

## 2021-12-28 ENCOUNTER — Telehealth: Payer: Self-pay | Admitting: Plastic Surgery

## 2021-12-28 ENCOUNTER — Ambulatory Visit (INDEPENDENT_AMBULATORY_CARE_PROVIDER_SITE_OTHER): Payer: 59 | Admitting: Plastic Surgery

## 2021-12-28 VITALS — BP 122/66 | HR 62 | Ht 70.0 in | Wt 189.2 lb

## 2021-12-28 DIAGNOSIS — S02652D Fracture of angle of left mandible, subsequent encounter for fracture with routine healing: Secondary | ICD-10-CM | POA: Diagnosis not present

## 2021-12-28 NOTE — Progress Notes (Signed)
Called patient to discuss mandibular fracture.  Left message and waiting for callback. ?

## 2021-12-28 NOTE — Progress Notes (Signed)
? ?  Referring Provider ?Taylor, Malena M, DO ?[address] ?[place],  [place] [address]  ? ?CC:  ?Mandible fracture ? ? ?Jon Mclaughlin is an 21 y.o. male.  ?HPI: 21-year-old with left mandible angle fracture.  The patient struck a metal railing.  He is concerned about his occlusion.  He notes that he noted not inclusion in the morning after his accident ? ?No Known Allergies ? ?Outpatient Encounter Medications as of 12/28/2021  ?Medication Sig  ? albuterol (VENTOLIN HFA) 108 (90 Base) MCG/ACT inhaler Inhale 2 puffs into the lungs every 6 (six) hours as needed for wheezing or shortness of breath.  ? hydrOXYzine (VISTARIL) 25 MG capsule Take 1/2 -1 tab p.o. tid prn anxiety  ? ?No facility-administered encounter medications on file as of 12/28/2021.  ?  ? ?Past Medical History:  ?Diagnosis Date  ? Asthma   ? Migraine headache   ? Recurrent allergic croup   ? ? ?No past surgical history on file. ? ?No family history on file. ? ?Social History  ? ?Social History Narrative  ? Not on file  ?  ? ?Review of Systems ?General: Denies fevers, chills, weight loss ?CV: Denies chest pain, shortness of breath, palpitations ? ? ?Physical Exam ? ?  12/28/2021  ?  4:40 PM 12/27/2021  ?  9:48 PM 12/27/2021  ?  9:46 PM  ?Vitals with BMI  ?Height 5' 10"    ?Weight 189 lbs 3 oz    ?BMI 27.15    ?Systolic 122  126  ?Diastolic 66  76  ?Pulse 62 76   ?  ?General:  No acute distress,  Alert and oriented, Non-Toxic, Normal speech and affect ?HEENT: Patient unable to produce class I occlusion.  No intraoral lacerations noted. ? ?Assessment/Plan ?Patient with nondisplaced ankle fracture with malocclusion.  MMF is indicated.  I think there is a good chance that his malocclusion could be due to swelling but MMF is indicated with possible ORIF if needed to stabilize bone. ? ?Jasman Pfeifle ?12/28/2021, 9:38 PM  ? ? ?  ?

## 2021-12-28 NOTE — H&P (View-Only) (Signed)
? ?  Referring Provider ?Ladona Ridgel, Malena M, DO ?[address] ?[place],  [place] [address]  ? ?CC:  ?Mandible fracture ? ? ?Jon Mclaughlin is an 21 y.o. male.  ?HPI: 21 year old with left mandible angle fracture.  The patient struck a metal railing.  He is concerned about his occlusion.  He notes that he noted not inclusion in the morning after his accident ? ?No Known Allergies ? ?Outpatient Encounter Medications as of 12/28/2021  ?Medication Sig  ? albuterol (VENTOLIN HFA) 108 (90 Base) MCG/ACT inhaler Inhale 2 puffs into the lungs every 6 (six) hours as needed for wheezing or shortness of breath.  ? hydrOXYzine (VISTARIL) 25 MG capsule Take 1/2 -1 tab p.o. tid prn anxiety  ? ?No facility-administered encounter medications on file as of 12/28/2021.  ?  ? ?Past Medical History:  ?Diagnosis Date  ? Asthma   ? Migraine headache   ? Recurrent allergic croup   ? ? ?No past surgical history on file. ? ?No family history on file. ? ?Social History  ? ?Social History Narrative  ? Not on file  ?  ? ?Review of Systems ?General: Denies fevers, chills, weight loss ?CV: Denies chest pain, shortness of breath, palpitations ? ? ?Physical Exam ? ?  12/28/2021  ?  4:40 PM 12/27/2021  ?  9:48 PM 12/27/2021  ?  9:46 PM  ?Vitals with BMI  ?Height 5\' 10"     ?Weight 189 lbs 3 oz    ?BMI 27.15    ?Systolic 122  126  ?Diastolic 66  76  ?Pulse 62 76   ?  ?General:  No acute distress,  Alert and oriented, Non-Toxic, Normal speech and affect ?HEENT: Patient unable to produce class I occlusion.  No intraoral lacerations noted. ? ?Assessment/Plan ?Patient with nondisplaced ankle fracture with malocclusion.  MMF is indicated.  I think there is a good chance that his malocclusion could be due to swelling but MMF is indicated with possible ORIF if needed to stabilize bone. ? ? ?12/28/2021, 9:38 PM  ? ? ?  ?

## 2021-12-29 ENCOUNTER — Other Ambulatory Visit: Payer: Self-pay

## 2021-12-29 ENCOUNTER — Encounter (HOSPITAL_BASED_OUTPATIENT_CLINIC_OR_DEPARTMENT_OTHER): Payer: Self-pay | Admitting: Plastic Surgery

## 2021-12-29 NOTE — Telephone Encounter (Signed)
Called pt to discuss mandible fx and left message. ?

## 2021-12-29 NOTE — ED Provider Notes (Signed)
?[facility] ? ? ? ?CSN: [id number] ?Arrival date & time: 12/25/21  1653 ? ? ?  ? ?History   ?Chief Complaint ?No chief complaint on file. ? ? ?HPI ?Jon Mclaughlin is a 21 y.o. male.  ? ?Presenting today with significant leg jaw pain, inability to open mouth to eat after falling off a 6 ft ledge last night and hitting the left side of his face on a metal pole. He did not lose consciousness, and denies severe headache, dizziness, N/V, visual change. Trying OTC pain relievers with minimal relief.  ? ? ?Past Medical History:  ?Diagnosis Date  ? Anxiety   ? Asthma   ? Migraine headache   ? Recurrent allergic croup   ? ? ?Patient Active Problem List  ? Diagnosis Date Noted  ? Strep sore throat 08/19/2020  ? Chronic lower back pain 11/02/2018  ? Migraine headache 09/09/2013  ? Asthma with acute exacerbation 07/17/2013  ? ? ?History reviewed. No pertinent surgical history. ? ? ? ? ?Home Medications   ? ?Prior to Admission medications   ?Medication Sig Start Date End Date Taking? Authorizing Provider  ?albuterol (VENTOLIN HFA) 108 (90 Base) MCG/ACT inhaler Inhale 2 puffs into the lungs every 6 (six) hours as needed for wheezing or shortness of breath. 07/30/20   Annalee Genta, DO  ?mirtazapine (REMERON) 30 MG tablet Take 30 mg by mouth at bedtime.    [provider]  ? ? ?Family History ?History reviewed. No pertinent family history. ? ?Social History ?Social History  ? ?Tobacco Use  ? Smoking status: Never  ? Smokeless tobacco: Never  ?Vaping Use  ? Vaping Use: Never used  ?Substance Use Topics  ? Alcohol use: Never  ? Drug use: Never  ? ? ? ?Allergies   ?Patient has no known allergies. ? ? ?Review of Systems ?Review of Systems ?PER HPI ? ?Physical Exam ?Triage Vital Signs ?ED Triage Vitals  ?Enc Vitals Group  ?   BP 12/25/21 1722 136/73  ?   Pulse Rate 12/25/21 1722 75  ?   Resp 12/25/21 1722 18  ?   Temp 12/25/21 1722 97.7 ?F (36.5 ?C)  ?   Temp Source 12/25/21 1722 Oral  ?   SpO2 12/25/21 1722 98 %   ?   Weight --   ?   Height --   ?   Head Circumference --   ?   Peak Flow --   ?   Pain Score 12/25/21 1723 9  ?   Pain Loc --   ?   Pain Edu? --   ?   Excl. in GC? --   ? ?No data found. ? ?Updated Vital Signs ?BP 136/73 (BP Location: Right Arm)   Pulse 75   Temp 97.7 ?F (36.5 ?C) (Oral)   Resp 18   SpO2 98%  ? ?Visual Acuity ?Right Eye Distance:   ?Left Eye Distance:   ?Bilateral Distance:   ? ?Right Eye Near:   ?Left Eye Near:    ?Bilateral Near:    ? ?Physical Exam ?Vitals and nursing note reviewed.  ?Constitutional:   ?   Appearance: Normal appearance.  ?HENT:  ?   Mouth/Throat:  ?   Mouth: Mucous membranes are moist.  ?Eyes:  ?   Extraocular Movements: Extraocular movements intact.  ?   Conjunctiva/sclera: Conjunctivae normal.  ?Cardiovascular:  ?   Rate and Rhythm: Normal rate and regular rhythm.  ?Pulmonary:  ?   Effort: Pulmonary effort  is normal.  ?   Breath sounds: Normal breath sounds.  ?Musculoskeletal:     ?   General: Swelling, tenderness and signs of injury present. No deformity. Normal range of motion.  ?   Cervical back: Normal range of motion and neck supple.  ?   Comments: Severe ttp left upper jaw, minimal ROM in jaw. Mild swelling left jaw  ?Skin: ?   General: Skin is warm and dry.  ?   Findings: No bruising.  ?Neurological:  ?   General: No focal deficit present.  ?   Mental Status: He is oriented to person, place, and time.  ?   Motor: No weakness.  ?   Gait: Gait normal.  ?Psychiatric:     ?   Mood and Affect: Mood normal.     ?   Thought Content: Thought content normal.     ?   Judgment: Judgment normal.  ? ? ? ?UC Treatments / Results  ?Labs ?(all labs ordered are listed, but only abnormal results are displayed) ?Labs Reviewed - No data to display ? ?EKG ? ? ?Radiology ?No results found. ? ?Procedures ?Procedures (including critical care time) ? ?Medications Ordered in UC ?Medications - No data to display ? ?Initial Impression / Assessment and Plan / UC Course  ?I have reviewed the  triage vital signs and the nursing notes. ? ?Pertinent labs & imaging results that were available during my care of the patient were reviewed by me and considered in my medical decision making (see chart for details). ? ?  ? ?Discussed need for facial CT given mechanism of injury and sxs. Recommended ED for further evaluation. Patient agreeable and wishes to go private vehicle. Hemodynamically stable and neurologically intact for transport.  ? ?Final Clinical Impressions(s) / UC Diagnoses  ? ?Final diagnoses:  ?Facial injury, initial encounter  ?Fall, initial encounter  ?Jaw swelling  ? ?Discharge Instructions   ?None ?  ? ?ED Prescriptions   ?None ?  ? ?PDMP not reviewed this encounter. ?  ?Particia Nearing, PA-C ?12/29/21 2141 ? ?

## 2021-12-30 ENCOUNTER — Telehealth: Payer: Self-pay

## 2021-12-30 ENCOUNTER — Encounter (HOSPITAL_BASED_OUTPATIENT_CLINIC_OR_DEPARTMENT_OTHER): Payer: Self-pay | Admitting: Plastic Surgery

## 2021-12-30 ENCOUNTER — Ambulatory Visit (HOSPITAL_BASED_OUTPATIENT_CLINIC_OR_DEPARTMENT_OTHER)
Admission: RE | Admit: 2021-12-30 | Discharge: 2021-12-30 | Disposition: A | Discharge status: Home / Self Care | Payer: 59 | Attending: Plastic Surgery | Admitting: Plastic Surgery

## 2021-12-30 ENCOUNTER — Ambulatory Visit (HOSPITAL_BASED_OUTPATIENT_CLINIC_OR_DEPARTMENT_OTHER): Payer: 59 | Admitting: Anesthesiology

## 2021-12-30 ENCOUNTER — Other Ambulatory Visit: Payer: Self-pay

## 2021-12-30 ENCOUNTER — Telehealth: Payer: Self-pay | Admitting: Physician Assistant

## 2021-12-30 ENCOUNTER — Telehealth: Payer: Self-pay | Admitting: Plastic Surgery

## 2021-12-30 ENCOUNTER — Encounter (HOSPITAL_BASED_OUTPATIENT_CLINIC_OR_DEPARTMENT_OTHER)
Admission: RE | Disposition: A | Discharge status: Home / Self Care | Payer: Self-pay | Source: Home / Self Care | Attending: Plastic Surgery

## 2021-12-30 DIAGNOSIS — J45909 Unspecified asthma, uncomplicated: Secondary | ICD-10-CM | POA: Insufficient documentation

## 2021-12-30 DIAGNOSIS — F419 Anxiety disorder, unspecified: Secondary | ICD-10-CM | POA: Diagnosis not present

## 2021-12-30 DIAGNOSIS — S02609A Fracture of mandible, unspecified, initial encounter for closed fracture: Secondary | ICD-10-CM | POA: Insufficient documentation

## 2021-12-30 DIAGNOSIS — W228XXA Striking against or struck by other objects, initial encounter: Secondary | ICD-10-CM | POA: Diagnosis not present

## 2021-12-30 DIAGNOSIS — S02652A Fracture of angle of left mandible, initial encounter for closed fracture: Secondary | ICD-10-CM | POA: Diagnosis not present

## 2021-12-30 DIAGNOSIS — S02602A Fracture of unspecified part of body of left mandible, initial encounter for closed fracture: Secondary | ICD-10-CM | POA: Diagnosis not present

## 2021-12-30 DIAGNOSIS — Z79899 Other long term (current) drug therapy: Secondary | ICD-10-CM | POA: Diagnosis not present

## 2021-12-30 HISTORY — PX: CLOSED REDUCTION MANDIBLE WITH MANDIBULOMA: SHX5313

## 2021-12-30 HISTORY — DX: Anxiety disorder, unspecified: F41.9

## 2021-12-30 SURGERY — CLOSED REDUCTION, MANDIBLE, WITH ARCH BAR APPLICATION AND INTERMAXILLARY FIXATION
Anesthesia: General | Site: Mouth | Laterality: Left

## 2021-12-30 MED ORDER — OXYCODONE HCL 5 MG PO TABS: 5.0000 mg | ORAL_TABLET | Freq: Once | ORAL | Status: AC | PRN

## 2021-12-30 MED ORDER — ONDANSETRON HCL 4 MG/2ML IJ SOLN
INTRAMUSCULAR | Status: AC
  Filled 2021-12-30: qty 2

## 2021-12-30 MED ORDER — FENTANYL CITRATE (PF) 100 MCG/2ML IJ SOLN
INTRAMUSCULAR | Status: AC
  Filled 2021-12-30: qty 2

## 2021-12-30 MED ORDER — CEFAZOLIN SODIUM-DEXTROSE 2-4 GM/100ML-% IV SOLN
INTRAVENOUS | Status: AC
  Filled 2021-12-30: qty 100

## 2021-12-30 MED ORDER — LIDOCAINE HCL (CARDIAC) PF 100 MG/5ML IV SOSY
PREFILLED_SYRINGE | INTRAVENOUS | Status: DC | PRN
  Administered 2021-12-30: 60 mg via INTRAVENOUS

## 2021-12-30 MED ORDER — ACETAMINOPHEN 325 MG PO TABS: 325.0000 mg | ORAL_TABLET | ORAL | Status: DC | PRN

## 2021-12-30 MED ORDER — CHLORHEXIDINE GLUCONATE CLOTH 2 % EX PADS: 6.0000 | MEDICATED_PAD | Freq: Once | CUTANEOUS | Status: DC

## 2021-12-30 MED ORDER — BUPIVACAINE-EPINEPHRINE 0.25% -1:200000 IJ SOLN
INTRAMUSCULAR | Status: DC | PRN
Start: 2021-12-30 — End: 2021-12-30
  Administered 2021-12-30: 7 mL

## 2021-12-30 MED ORDER — OXYCODONE HCL 5 MG/5ML PO SOLN
5.0000 mg | Freq: Once | ORAL | Status: AC | PRN
  Administered 2021-12-30: 5 mg via ORAL

## 2021-12-30 MED ORDER — FENTANYL CITRATE (PF) 100 MCG/2ML IJ SOLN
50.0000 ug | Freq: Once | INTRAMUSCULAR | Status: AC
  Administered 2021-12-30: 50 ug via INTRAVENOUS

## 2021-12-30 MED ORDER — OXYCODONE HCL 5 MG/5ML PO SOLN: 5.0000 mg | Freq: Four times a day (QID) | ORAL | 0 refills | Status: DC | PRN

## 2021-12-30 MED ORDER — OXYCODONE HCL 5 MG/5ML PO SOLN
ORAL | Status: AC
  Filled 2021-12-30: qty 5

## 2021-12-30 MED ORDER — ONDANSETRON HCL 4 MG/2ML IJ SOLN
INTRAMUSCULAR | Status: AC
Start: 2021-12-30 — End: ?
  Filled 2021-12-30: qty 2

## 2021-12-30 MED ORDER — DEXAMETHASONE SODIUM PHOSPHATE 10 MG/ML IJ SOLN
INTRAMUSCULAR | Status: AC
  Filled 2021-12-30: qty 1

## 2021-12-30 MED ORDER — ACETAMINOPHEN 160 MG/5ML PO SOLN: 325.0000 mg | ORAL | Status: DC | PRN

## 2021-12-30 MED ORDER — SUGAMMADEX SODIUM 200 MG/2ML IV SOLN
INTRAVENOUS | Status: DC | PRN
  Administered 2021-12-30: 200 mg via INTRAVENOUS

## 2021-12-30 MED ORDER — FENTANYL CITRATE (PF) 100 MCG/2ML IJ SOLN
INTRAMUSCULAR | Status: DC | PRN
  Administered 2021-12-30 (×2): 25 ug via INTRAVENOUS
  Administered 2021-12-30: 50 ug via INTRAVENOUS

## 2021-12-30 MED ORDER — ONDANSETRON HCL 4 MG/2ML IJ SOLN
INTRAMUSCULAR | Status: DC | PRN
  Administered 2021-12-30: 4 mg via INTRAVENOUS

## 2021-12-30 MED ORDER — LIDOCAINE-EPINEPHRINE 1 %-1:100000 IJ SOLN
INTRAMUSCULAR | Status: AC
Start: 2021-12-30 — End: ?
  Filled 2021-12-30: qty 1

## 2021-12-30 MED ORDER — DEXMEDETOMIDINE (PRECEDEX) IN NS 20 MCG/5ML (4 MCG/ML) IV SYRINGE
PREFILLED_SYRINGE | INTRAVENOUS | Status: DC | PRN
  Administered 2021-12-30 (×5): 4 ug via INTRAVENOUS

## 2021-12-30 MED ORDER — CEFAZOLIN SODIUM-DEXTROSE 2-4 GM/100ML-% IV SOLN
2.0000 g | INTRAVENOUS | Status: AC
  Administered 2021-12-30: 2 g via INTRAVENOUS

## 2021-12-30 MED ORDER — KETOROLAC TROMETHAMINE 30 MG/ML IJ SOLN
INTRAMUSCULAR | Status: AC
  Filled 2021-12-30: qty 1

## 2021-12-30 MED ORDER — LIDOCAINE 2% (20 MG/ML) 5 ML SYRINGE
INTRAMUSCULAR | Status: AC
  Filled 2021-12-30: qty 5

## 2021-12-30 MED ORDER — MIDAZOLAM HCL 5 MG/5ML IJ SOLN
INTRAMUSCULAR | Status: DC | PRN
  Administered 2021-12-30: 2 mg via INTRAVENOUS

## 2021-12-30 MED ORDER — PROPOFOL 10 MG/ML IV BOLUS
INTRAVENOUS | Status: AC
  Filled 2021-12-30: qty 20

## 2021-12-30 MED ORDER — KETOROLAC TROMETHAMINE 30 MG/ML IJ SOLN
30.0000 mg | Freq: Once | INTRAMUSCULAR | Status: AC
Start: 2021-12-30 — End: 2021-12-30
  Administered 2021-12-30: 30 mg via INTRAVENOUS

## 2021-12-30 MED ORDER — ACETAMINOPHEN 10 MG/ML IV SOLN
INTRAVENOUS | Status: AC
  Filled 2021-12-30: qty 100

## 2021-12-30 MED ORDER — HYDROCODONE-ACETAMINOPHEN 7.5-325 MG/15ML PO SOLN: 10.0000 mL | Freq: Four times a day (QID) | ORAL | 0 refills | Status: AC | PRN

## 2021-12-30 MED ORDER — DEXAMETHASONE SODIUM PHOSPHATE 4 MG/ML IJ SOLN
INTRAMUSCULAR | Status: DC | PRN
  Administered 2021-12-30: 10 mg via INTRAVENOUS

## 2021-12-30 MED ORDER — OXYMETAZOLINE HCL 0.05 % NA SOLN
NASAL | Status: DC | PRN
  Administered 2021-12-30: 2 via NASAL

## 2021-12-30 MED ORDER — LACTATED RINGERS IV SOLN: INTRAVENOUS | Status: DC

## 2021-12-30 MED ORDER — CHLORHEXIDINE GLUCONATE 0.12 % MT SOLN
OROMUCOSAL | Status: DC | PRN
  Administered 2021-12-30: 15 mL via OROMUCOSAL

## 2021-12-30 MED ORDER — PROPOFOL 10 MG/ML IV BOLUS
INTRAVENOUS | Status: DC | PRN
  Administered 2021-12-30: 200 mg via INTRAVENOUS

## 2021-12-30 MED ORDER — FENTANYL CITRATE (PF) 100 MCG/2ML IJ SOLN
25.0000 ug | INTRAMUSCULAR | Status: DC | PRN
  Administered 2021-12-30 (×3): 50 ug via INTRAVENOUS

## 2021-12-30 MED ORDER — MIDAZOLAM HCL 2 MG/2ML IJ SOLN
INTRAMUSCULAR | Status: AC
  Filled 2021-12-30: qty 2

## 2021-12-30 MED ORDER — ACETAMINOPHEN 10 MG/ML IV SOLN
1000.0000 mg | Freq: Once | INTRAVENOUS | Status: DC | PRN
  Administered 2021-12-30: 1000 mg via INTRAVENOUS

## 2021-12-30 MED ORDER — ROCURONIUM BROMIDE 100 MG/10ML IV SOLN
INTRAVENOUS | Status: DC | PRN
  Administered 2021-12-30: 60 mg via INTRAVENOUS

## 2021-12-30 MED ORDER — DEXMEDETOMIDINE (PRECEDEX) IN NS 20 MCG/5ML (4 MCG/ML) IV SYRINGE
PREFILLED_SYRINGE | INTRAVENOUS | Status: AC
Start: 2021-12-30 — End: ?
  Filled 2021-12-30: qty 5

## 2021-12-30 MED ORDER — OXYCODONE HCL 5 MG PO TABS
ORAL_TABLET | ORAL | Status: AC
  Filled 2021-12-30: qty 1

## 2021-12-30 MED ORDER — AMISULPRIDE (ANTIEMETIC) 5 MG/2ML IV SOLN: 10.0000 mg | Freq: Once | INTRAVENOUS | Status: DC | PRN

## 2021-12-30 SURGICAL SUPPLY — 53 items
ADH SKN CLS APL DERMABOND .7 (GAUZE/BANDAGES/DRESSINGS)
ARCH BAR ERICH 01 0299 IMPLANT
CANISTER SUCT 1200ML W/VALVE (MISCELLANEOUS) IMPLANT
CLEANER CAUTERY TIP 5X5 PAD (MISCELLANEOUS) IMPLANT
DEPRESSOR TONGUE BLADE STERILE (MISCELLANEOUS) IMPLANT
DERMABOND ADVANCED (GAUZE/BANDAGES/DRESSINGS)
DERMABOND ADVANCED .7 DNX12 (GAUZE/BANDAGES/DRESSINGS) IMPLANT
ELECT COATED BLADE 2.86 ST (ELECTRODE) ×2 IMPLANT
ELECT NDL BLADE 2-5/6 (NEEDLE) ×1 IMPLANT
ELECT NEEDLE BLADE 2-5/6 (NEEDLE) ×2 IMPLANT
ELECT REM PT RETURN 9FT ADLT (ELECTROSURGICAL) ×2
ELECTRODE REM PT RTRN 9FT ADLT (ELECTROSURGICAL) ×1 IMPLANT
GAUZE PACKING FOLDED 2  STR (GAUZE/BANDAGES/DRESSINGS)
GAUZE PACKING FOLDED 2 STR (GAUZE/BANDAGES/DRESSINGS) IMPLANT
GAUZE VASELINE FOILPK 1/2 X 72 (GAUZE/BANDAGES/DRESSINGS) IMPLANT
GLOVE BIO SURGEON STRL SZ 6.5 (GLOVE) ×2 IMPLANT
GOWN STRL REUS W/ TWL LRG LVL3 (GOWN DISPOSABLE) ×2 IMPLANT
GOWN STRL REUS W/TWL LRG LVL3 (GOWN DISPOSABLE) ×4
NDL BLUNT 17GA (NEEDLE) IMPLANT
NDL HYPO 27GX1-1/4 (NEEDLE) ×1 IMPLANT
NEEDLE BLUNT 17GA (NEEDLE) IMPLANT
NEEDLE HYPO 27GX1-1/4 (NEEDLE) ×2 IMPLANT
NS IRRIG 1000ML POUR BTL (IV SOLUTION) ×2 IMPLANT
PACK BASIN DAY SURGERY FS (CUSTOM PROCEDURE TRAY) ×2 IMPLANT
PACK ENT DAY SURGERY (CUSTOM PROCEDURE TRAY) ×2 IMPLANT
PAD CLEANER CAUTERY TIP 5X5 (MISCELLANEOUS)
PATTIES SURGICAL .5 X3 (DISPOSABLE) IMPLANT
PENCIL SMOKE EVACUATOR (MISCELLANEOUS) ×2 IMPLANT
PLATE HYBRID GOLD MMF (Plate) ×2 IMPLANT
PLATE HYBRID MMF GOLD (Plate) IMPLANT
PLATE HYBRID MMF SM (Plate) ×1 IMPLANT
SCISSORS WIRE ANG 4 3/4 DISP (INSTRUMENTS) IMPLANT
SCREW LOCK SELFDRIL 2.0X8M MMF (Screw) ×10 IMPLANT
SCREW LOCKING SELF DRILL 2.0X6 (Screw) ×7 IMPLANT
SET WALTER ACTIVATION W/DRAPE (SET/KITS/TRAYS/PACK) IMPLANT
SHEILD EYE MED CORNL SHD 22X21 (OPHTHALMIC RELATED)
SHIELD EYE MED CORNL SHD 22X21 (OPHTHALMIC RELATED) IMPLANT
SLEEVE SCD COMPRESS KNEE MED (STOCKING) ×1 IMPLANT
SPIKE FLUID TRANSFER (MISCELLANEOUS) ×1 IMPLANT
SUCTION FRAZIER HANDLE 10FR (MISCELLANEOUS) ×1
SUCTION TUBE FRAZIER 10FR DISP (MISCELLANEOUS) IMPLANT
SUT STEEL 0 (SUTURE)
SUT STEEL 0 18XMFL TIE 17 (SUTURE) IMPLANT
SUT STEEL 1 (SUTURE) IMPLANT
SUT STEEL 2 (SUTURE) IMPLANT
SUT VIC AB 3-0 FS2 27 (SUTURE) IMPLANT
SUT VICRYL 4-0 PS2 18IN ABS (SUTURE) ×2 IMPLANT
SYR BULB EAR ULCER 3OZ GRN STR (SYRINGE) ×1 IMPLANT
TOOTHBRUSH ADULT (PERSONAL CARE ITEMS) ×2 IMPLANT
TOWEL GREEN STERILE FF (TOWEL DISPOSABLE) ×2 IMPLANT
TRAY DSU PREP LF (CUSTOM PROCEDURE TRAY) ×2 IMPLANT
TUBE SALEM SUMP 16 FR W/ARV (TUBING) IMPLANT
YANKAUER SUCT BULB TIP NO VENT (SUCTIONS) ×2 IMPLANT

## 2021-12-30 NOTE — Telephone Encounter (Signed)
[facility] called again at 4:38 and left vm; per [place] they don't have the oxycodone liquid but have an alternative called Hycet. Pharmacists said maybe that will get patient through the night but no one in [place] has oxycodone liquid. Pt's Dad is not willing to try [place] [facility] or surrounding areas because he doesn't want to drive that far.  ?

## 2021-12-30 NOTE — Discharge Instructions (Addendum)
Activity As tolerated: ?NO driving while taking narcotics. ?No heavy activities ? ?Diet: Liquid diet only. ? ?Oral care: Rinse mouth 2x daily with Listerine.  ? ?Special Instructions: ?Call Doctor if any unusual problems occur such as pain, excessive bleeding, unrelieved Nausea/vomiting, Fever &/or chills ?Be careful when rinsing/eating to avoid rubber bands in your mouth from popping. If your rubber bands pop, notify our clinic and we may need to replace them in the office. ? ?Follow-up appointment: Scheduled for next week. ? ? ? ?Post Anesthesia Home Care Instructions ? ?Activity: ?Get plenty of rest for the remainder of the day. A responsible individual must stay with you for 24 hours following the procedure.  ?For the next 24 hours, DO NOT: ?-Drive a car ?-Advertising copywriter ?-Drink alcoholic beverages ?-Take any medication unless instructed by your physician ?-Make any legal decisions or sign important papers. ? ?Meals: ?Start with liquid foods such as gelatin or soup. Progress to regular foods as tolerated. Avoid greasy, spicy, heavy foods. If nausea and/or vomiting occur, drink only clear liquids until the nausea and/or vomiting subsides. Call your physician if vomiting continues. ? ?Special Instructions/Symptoms: ?Your throat may feel dry or sore from the anesthesia or the breathing tube placed in your throat during surgery. If this causes discomfort, gargle with warm salt water. The discomfort should disappear within 24 hours. ? ?If you had a scopolamine patch placed behind your ear for the management of post- operative nausea and/or vomiting: ? ?1. The medication in the patch is effective for 72 hours, after which it should be removed.  Wrap patch in a tissue and discard in the trash. Wash hands thoroughly with soap and water. ?2. You may remove the patch earlier than 72 hours if you experience unpleasant side effects which may include dry mouth, dizziness or visual disturbances. ?3. Avoid touching the  patch. Wash your hands with soap and water after contact with the patch. ?    ? ?

## 2021-12-30 NOTE — Anesthesia Procedure Notes (Signed)
Procedure Name: Intubation ?Date/Time: 12/30/2021 10:25 AM ?Performed by: Maryella Shivers, CRNA ?Pre-anesthesia Checklist: Patient identified, Emergency Drugs available, Suction available and Patient being monitored ?Patient Re-evaluated:Patient Re-evaluated prior to induction ?Oxygen Delivery Method: Circle system utilized ?Preoxygenation: Pre-oxygenation with 100% oxygen ?Induction Type: IV induction ?Ventilation: Mask ventilation without difficulty ?Laryngoscope Size: Mac and 4 ?Grade View: Grade I ?Nasal Tubes: Nasal prep performed, Nasal Rae and Right ?Tube size: 7.0 mm ?Number of attempts: 1 ?Placement Confirmation: ETT inserted through vocal cords under direct vision, positive ETCO2 and breath sounds checked- equal and bilateral ?Secured at: 27 cm ?Tube secured with: Tape ?Dental Injury: Teeth and Oropharynx as per pre-operative assessment  ? ? ? ? ?

## 2021-12-30 NOTE — Interval H&P Note (Signed)
History and Physical Interval Note: ? ?12/30/2021 ?10:12 AM ? ?Jon Mclaughlin  has presented today for surgery, with the diagnosis of Closed fracture of left mandibular angle with routine healing, subsequent encounter.  The various methods of treatment have been discussed with the patient and family. After consideration of risks, benefits and other options for treatment, the patient has consented to  Procedure(s) with comments: ?CLOSED REDUCTION WITH PLACEMENT OF HYBRID MMF ARCH BARS (Left) - 1 hour ?OPEN REDUCTION INTERNAL FIXATION (ORIF) MANDIBULAR FRACTURE (Left) as a surgical intervention.  The patient's history has been reviewed, patient examined, no change in status, stable for surgery.  I have reviewed the patient's chart and labs.  Questions were answered to the patient's satisfaction.   ? ? ?Lennice Sites ? ? ?

## 2021-12-30 NOTE — Telephone Encounter (Signed)
Called pt, na, left vm to call us back to give alternate pharmacy so that medication could be sent there.  ?

## 2021-12-30 NOTE — Telephone Encounter (Signed)
Spoke to patient's father multiple times regarding the pain medication that was called in for him. He stated he called all around [place] and no one has the suspension. He is not willing to go to [place] or [place] if it was available. He didn't want to go that far. Son is in a lot of pain and does not understand why I am unable to send something else in or able to speak with Dr. Domenica Reamer right now. I informed that messages have been sent to Dr. Domenica Reamer and [facility] regarding the medication request and as soon as they are finished with surgery they would have a chance to respond. That was still not good enough for him and he hung up. ?

## 2021-12-30 NOTE — Op Note (Signed)
Operative Note  ? ?DATE OF OPERATION: 12/30/2021 ? ?SURGICAL DEPARTMENT: Plastic Surgery ? ?PREOPERATIVE DIAGNOSES:  mandible fracture left angle ? ?POSTOPERATIVE DIAGNOSES:  same ? ?PROCEDURE: Placement of MMF with hybrid arch bars. ? ?SURGEON: Melene Plan. Dontarius Sheley, MD ? ?ASSISTANT:  ?Roetta Sessions, PA ?Donnamarie Rossetti, PA ? ?ANESTHESIA:  General.  ? ?COMPLICATIONS: None.  ? ?INDICATIONS FOR PROCEDURE:  ?The patient, Jon Mclaughlin is a 21 y.o. male born on 09-26-2000, is here for treatment of left angle of mandible fracture. ?MRN: [id number] ? ?CONSENT:  ?Informed consent was obtained directly from the patient. Risks, benefits and alternatives were fully discussed. Specific risks including but not limited to bleeding, infection, hematoma, seroma, scarring, pain, contracture, asymmetry, wound healing problems, and need for further surgery were all discussed. The patient did have an ample opportunity to have questions answered to satisfaction.  ? ?DESCRIPTION OF PROCEDURE:  ?The patient was taken to the operating room. SCDs were placed and preop antibiotics were given. General nasal anesthesia was administered.  The patient's operative site was prepped and draped in a sterile fashion. A time out was performed and all information was confirmed to be correct.   ? ?After injecting the patient with quarter percent Marcaine with epinephrine in the gingival area we proceeded to apply hybrid arch bars.  We used primarily 6 mm screws for the maxillary arch bar and 8 mm screws for the mandibular arch bar.  Was able to place the patient in what appears to be his native occlusion.  There is no evidence of any fracture displacement on palpating the fracture interoperatively.  Rubber bands were used to maintain occlusion.   ? ?The patient tolerated the procedure well.  There were no complications. The patient was allowed to wake from anesthesia, extubated and taken to the recovery room in satisfactory condition.   ?

## 2021-12-30 NOTE — Anesthesia Preprocedure Evaluation (Signed)
Anesthesia Evaluation  ?Patient identified by MRN, date of birth, ID band ?Patient awake ? ? ? ?Reviewed: ?Allergy & Precautions, NPO status , Patient's Chart, lab work & pertinent test results ? ?Airway ? ? ? ? ? ?Mouth opening: Limited Mouth Opening ? Dental ?no notable dental hx. ?(+) Dental Advisory Given ?  ?Pulmonary ?asthma ,  ?  ?Pulmonary exam normal ? ? ? ? ? ? ? Cardiovascular ?negative cardio ROS ?Normal cardiovascular exam ? ? ?  ?Neuro/Psych ? Headaches, Anxiety   ? GI/Hepatic ?Neg liver ROS,   ?Endo/Other  ? ? Renal/GU ?negative Renal ROS  ? ?  ?Musculoskeletal ? ? Abdominal ?  ?Peds ? Hematology ?negative hematology ROS ?(+)   ?Anesthesia Other Findings ? ? Reproductive/Obstetrics ? ?  ? ? ? ? ? ? ? ? ? ? ? ? ? ?  ?  ? ? ? ? ? ? ? ? ?Anesthesia Physical ?Anesthesia Plan ? ?ASA: 2 ? ?Anesthesia Plan: General  ? ?Post-op Pain Management:   ? ?Induction: Intravenous ? ?PONV Risk Score and Plan: 3 and Ondansetron, Dexamethasone and Midazolam ? ?Airway Management Planned: Nasal ETT ? ?Additional Equipment: None ? ?Intra-op Plan:  ? ?Post-operative Plan: Extubation in OR ? ?Informed Consent: I have reviewed the patients History and Physical, chart, labs and discussed the procedure including the risks, benefits and alternatives for the proposed anesthesia with the patient or authorized representative who has indicated his/her understanding and acceptance.  ? ? ? ? ? ?Plan Discussed with: CRNA ? ?Anesthesia Plan Comments:   ? ? ? ? ? ? ?Anesthesia Quick Evaluation ? ?

## 2021-12-30 NOTE — Telephone Encounter (Signed)
Thanks, let me know .

## 2021-12-30 NOTE — Telephone Encounter (Signed)
April pharmacist calling to state oxycodone liquid is not in stock and they called around and there is none available locally. She said they have Hycet in stock if the prescription could be changed to that. Call back for April is: [phone] ?

## 2021-12-30 NOTE — Telephone Encounter (Signed)
Patient's father calling to speak with provider; states patient is bleeding profusely from the mouth.  ? ?Patient's father also requested status of Rx for pain medication. ? ?Please advise at [phone] ?

## 2021-12-30 NOTE — Telephone Encounter (Signed)
Pharmacy called to report that they don't have the prescribed medication that was filed in stock. Patient will have to find this medication (oxycodone) elsewhere.  ? ? ?

## 2021-12-30 NOTE — Addendum Note (Signed)
Addended byRoetta Sessions on: 12/30/2021 05:08 PM ? ? Modules accepted: Orders ? ?

## 2021-12-30 NOTE — Transfer of Care (Signed)
Immediate Anesthesia Transfer of Care Note ? ?Patient: Jon Mclaughlin ? ?Procedure(s) Performed: CLOSED REDUCTION WITH PLACEMENT OF HYBRID MMF ARCH BARS (Left: Mouth) ? ?Patient Location: PACU ? ?Anesthesia Type:General ? ?Level of Consciousness: sedated ? ?Airway & Oxygen Therapy: Patient Spontanous Breathing and Patient connected to face mask oxygen ? ?Post-op Assessment: Report given to RN and Post -op Vital signs reviewed and stable ? ?Post vital signs: Reviewed and stable ? ?Last Vitals:  ?Vitals Value Taken Time  ?BP 147/61 12/30/21 1200  ?Temp 36.5 ?C 12/30/21 1200  ?Pulse 90 12/30/21 1204  ?Resp 20 12/30/21 1204  ?SpO2 100 % 12/30/21 1204  ?Vitals shown include unvalidated device data. ? ?Last Pain:  ?Vitals:  ? 12/30/21 1200  ?TempSrc:   ?PainSc: Asleep  ?   ? ?Patients Stated Pain Goal: 4 (12/30/21 5170) ? ?Complications: No notable events documented. ?

## 2021-12-30 NOTE — Telephone Encounter (Signed)
Spoke to pt's Dad, [place] and he expressed concern that pt was bleeding a lot from a procedure he had today with Dr. Domenica Reamer. He stated that he couldn't see which side the bleeding was coming from, didn't notice any swelling or bruising. He stated that pt is laying down now and the bleeding has seemed to slow down. I adv that I called both PA(s) to inform and would also send a message to Dr. Domenica Reamer just to inform. Either myself or someone from our office would call him back after speaking with a provider. He conveyed understanding.  ? ?Pt's dad asked for oxycodone Rx be sent to [facility] in [place] on [address]  ?

## 2021-12-30 NOTE — Telephone Encounter (Signed)
I have spoken to pt's father and sent message to both PA(s) and Dr. Domenica Reamer.  ?

## 2021-12-31 ENCOUNTER — Encounter: Payer: Self-pay | Admitting: Surgical

## 2021-12-31 ENCOUNTER — Telehealth: Payer: Self-pay | Admitting: Plastic Surgery

## 2021-12-31 NOTE — Telephone Encounter (Signed)
Called patient's father, Teodoro Kil. Discussed medication was rx, but pharmacy did not have in stock. Will send rx for Hycet. Patient's father was understanding. ? ? ?

## 2021-12-31 NOTE — Anesthesia Postprocedure Evaluation (Signed)
Anesthesia Post Note ? ?Patient: Jon Mclaughlin ? ?Procedure(s) Performed: CLOSED REDUCTION WITH PLACEMENT OF HYBRID MMF ARCH BARS (Left: Mouth) ? ?  ? ?Patient location during evaluation: PACU ?Anesthesia Type: General ?Level of consciousness: awake and alert ?Pain management: pain level controlled ?Vital Signs Assessment: post-procedure vital signs reviewed and stable ?Respiratory status: spontaneous breathing, nonlabored ventilation, respiratory function stable and patient connected to nasal cannula oxygen ?Cardiovascular status: blood pressure returned to baseline and stable ?Postop Assessment: no apparent nausea or vomiting ?Anesthetic complications: no ? ? ?No notable events documented. ? ?Last Vitals:  ?Vitals:  ? 12/30/21 1330 12/30/21 1345  ?BP:    ?Pulse: 74 75  ?Resp:    ?Temp:    ?SpO2: 96% 97%  ?   ?  ?  ?  ? ?Shelton Silvas ? ? ? ? ?

## 2021-12-31 NOTE — Telephone Encounter (Signed)
Called patient and scheduled for 01/01/22. Sent General School/Work excuse letter to patients mychart and printed copy for pickup during visit tomorrow.  ?

## 2021-12-31 NOTE — Telephone Encounter (Signed)
Patients father called to ask for a letter of excuse for patients school due to the surgery. Could we specify any restrictions necessary for the excuse letter? Also, father requested an excuse for the dates of 12/24/21 to 01/14/2022. Is this approved or should this be adjusted? ? ?Also, father reports that the rubber bands holding patients jaw closed have been snapping. 3 have snapped so far. Patient told his father that it still feels tight and closed but father wants to make sure whether a follow-up necessary or not.  ? ?Please advise and callback if needed.  ?

## 2022-01-01 ENCOUNTER — Ambulatory Visit (INDEPENDENT_AMBULATORY_CARE_PROVIDER_SITE_OTHER): Payer: 59 | Admitting: Surgical

## 2022-01-01 DIAGNOSIS — S02652D Fracture of angle of left mandible, subsequent encounter for fracture with routine healing: Secondary | ICD-10-CM

## 2022-01-01 MED ORDER — OXYCODONE HCL 5 MG/5ML PO SOLN
10.0000 mg | Freq: Three times a day (TID) | ORAL | 0 refills | Status: AC | PRN
Start: 2022-01-01 — End: 2022-01-03

## 2022-01-01 NOTE — Progress Notes (Signed)
Patient is a 21 year old male here for follow-up after closed reduction with placement of MMF arch bars with Dr. Domenica Reamer on 12/30/2021.  He is 2 days postop.  He is here for evaluation due to his rubber bands popping.  He reports a lot of pain, reports the Hycet is not providing much pain relief.  He is here with his father.  He reports it is difficult to determine if his teeth feel as if they are normally aligned due to it being very tight.  He reports his teeth have not moved ? ?On exam, the alignment appears to be in occlusion based on wear facets and based on the occlusion he achieved intraoperatively.  There is no erythema or cellulitic changes noted.  MMF bars are in place, bands are in place laterally but centrally all the bands have popped. ? ?MMF bands were placed.  Patient tolerated this well.  We will call in a slightly stronger prescription for the next 2 days to help manage postoperative pain.  Discussed specifically with patient and his father to not mix the oxycodone suspension and the Hycet suspension.  They were understanding and both in agreement with this.  Recommend continue with Tylenol and ibuprofen as well.  Recommend calling with questions or concerns, follow-up scheduled for next week with Dr. Domenica Reamer.  No signs of infection. ?

## 2022-01-04 ENCOUNTER — Ambulatory Visit (INDEPENDENT_AMBULATORY_CARE_PROVIDER_SITE_OTHER): Payer: 59 | Admitting: Plastic Surgery

## 2022-01-04 DIAGNOSIS — S02652D Fracture of angle of left mandible, subsequent encounter for fracture with routine healing: Secondary | ICD-10-CM

## 2022-01-06 NOTE — Progress Notes (Signed)
Status post placement of hybrid MMF arch bars.  He notes a couple of bands of popped on the right side.  He thinks his occlusion is right but is unsure. ? ?Physical exam ?Hybrid arch bars intact with some membranes missing on the right.  Occlusion looks good. ? ?Assessment and plan ?Doing well and occlusion now appears correct. ?Some rubber bands were placed and we will see the patient back in 1 to 2 weeks for recheck. ? ? ?

## 2022-01-18 ENCOUNTER — Ambulatory Visit (INDEPENDENT_AMBULATORY_CARE_PROVIDER_SITE_OTHER): Payer: 59 | Admitting: Plastic Surgery

## 2022-01-18 ENCOUNTER — Encounter: Payer: Self-pay | Admitting: Plastic Surgery

## 2022-01-18 DIAGNOSIS — S02652D Fracture of angle of left mandible, subsequent encounter for fracture with routine healing: Secondary | ICD-10-CM

## 2022-01-18 NOTE — Progress Notes (Signed)
Status post MMF for mandible fracture that is nondisplaced.  Patient endorses excellent occlusion today.  He says he no longer has any mandible pain. ? ?Physical exam ?Rubber bands intact, occlusion is intact and looks class I ? ?Assessment and plan ?We will keep the patient MMF for a total of 4 weeks.  If he does well with a soft diet after that we will schedule him for removal of arch bars in the operating room.  Since he is out of town he would like to do a video visit which is reasonable. ?

## 2022-01-29 ENCOUNTER — Ambulatory Visit (INDEPENDENT_AMBULATORY_CARE_PROVIDER_SITE_OTHER): Payer: 59 | Admitting: Plastic Surgery

## 2022-01-29 DIAGNOSIS — S02652D Fracture of angle of left mandible, subsequent encounter for fracture with routine healing: Secondary | ICD-10-CM

## 2022-01-30 NOTE — Progress Notes (Signed)
S/p mmf for mandible repair, has had rubber bands off and is tolerating a soft diet  PE Phone visit  A/P Will schedule hybrid arch bar removal.

## 2022-02-03 ENCOUNTER — Encounter (HOSPITAL_BASED_OUTPATIENT_CLINIC_OR_DEPARTMENT_OTHER): Payer: Self-pay | Admitting: Plastic Surgery

## 2022-02-03 ENCOUNTER — Other Ambulatory Visit: Payer: Self-pay

## 2022-02-09 ENCOUNTER — Ambulatory Visit (HOSPITAL_BASED_OUTPATIENT_CLINIC_OR_DEPARTMENT_OTHER)
Admission: RE | Admit: 2022-02-09 | Discharge: 2022-02-09 | Disposition: A | Discharge status: Home / Self Care | Payer: 59 | Attending: Plastic Surgery | Admitting: Plastic Surgery

## 2022-02-09 ENCOUNTER — Encounter (HOSPITAL_BASED_OUTPATIENT_CLINIC_OR_DEPARTMENT_OTHER)
Admission: RE | Disposition: A | Discharge status: Home / Self Care | Payer: Self-pay | Source: Home / Self Care | Attending: Plastic Surgery

## 2022-02-09 ENCOUNTER — Ambulatory Visit (HOSPITAL_BASED_OUTPATIENT_CLINIC_OR_DEPARTMENT_OTHER): Payer: 59 | Admitting: Certified Registered"

## 2022-02-09 ENCOUNTER — Encounter (HOSPITAL_BASED_OUTPATIENT_CLINIC_OR_DEPARTMENT_OTHER): Payer: Self-pay | Admitting: Plastic Surgery

## 2022-02-09 ENCOUNTER — Other Ambulatory Visit: Payer: Self-pay

## 2022-02-09 DIAGNOSIS — S02609A Fracture of mandible, unspecified, initial encounter for closed fracture: Secondary | ICD-10-CM

## 2022-02-09 DIAGNOSIS — Z472 Encounter for removal of internal fixation device: Secondary | ICD-10-CM | POA: Insufficient documentation

## 2022-02-09 DIAGNOSIS — S02602A Fracture of unspecified part of body of left mandible, initial encounter for closed fracture: Secondary | ICD-10-CM | POA: Diagnosis not present

## 2022-02-09 DIAGNOSIS — S02652D Fracture of angle of left mandible, subsequent encounter for fracture with routine healing: Secondary | ICD-10-CM

## 2022-02-09 HISTORY — PX: MINOR REMOVAL OF MANDIBULAR HARDWARE: SHX6427

## 2022-02-09 SURGERY — MINOR REMOVAL OF MANDIBULAR HARDWARE
Anesthesia: General | Site: Mouth | Laterality: Left

## 2022-02-09 MED ORDER — FENTANYL CITRATE (PF) 100 MCG/2ML IJ SOLN
INTRAMUSCULAR | Status: AC
  Filled 2022-02-09: qty 2

## 2022-02-09 MED ORDER — BUPIVACAINE-EPINEPHRINE (PF) 0.25% -1:200000 IJ SOLN
INTRAMUSCULAR | Status: AC
  Filled 2022-02-09: qty 30

## 2022-02-09 MED ORDER — ROCURONIUM BROMIDE 100 MG/10ML IV SOLN
INTRAVENOUS | Status: DC | PRN
  Administered 2022-02-09: 50 mg via INTRAVENOUS

## 2022-02-09 MED ORDER — MEPERIDINE HCL 25 MG/ML IJ SOLN: 6.2500 mg | INTRAMUSCULAR | Status: DC | PRN

## 2022-02-09 MED ORDER — SUGAMMADEX SODIUM 200 MG/2ML IV SOLN
INTRAVENOUS | Status: DC | PRN
  Administered 2022-02-09: 170.8 mg via INTRAVENOUS

## 2022-02-09 MED ORDER — HYDROMORPHONE HCL 1 MG/ML IJ SOLN: 0.2500 mg | INTRAMUSCULAR | Status: DC | PRN

## 2022-02-09 MED ORDER — OXYCODONE HCL 5 MG PO TABS: 5.0000 mg | ORAL_TABLET | Freq: Three times a day (TID) | ORAL | 0 refills | Status: AC | PRN

## 2022-02-09 MED ORDER — PROPOFOL 10 MG/ML IV BOLUS
INTRAVENOUS | Status: AC
  Filled 2022-02-09: qty 20

## 2022-02-09 MED ORDER — AMISULPRIDE (ANTIEMETIC) 5 MG/2ML IV SOLN: 10.0000 mg | Freq: Once | INTRAVENOUS | Status: DC | PRN

## 2022-02-09 MED ORDER — CHLORHEXIDINE GLUCONATE CLOTH 2 % EX PADS: 6.0000 | MEDICATED_PAD | Freq: Once | CUTANEOUS | Status: DC

## 2022-02-09 MED ORDER — LIDOCAINE HCL (CARDIAC) PF 100 MG/5ML IV SOSY
PREFILLED_SYRINGE | INTRAVENOUS | Status: DC | PRN
  Administered 2022-02-09: 80 mg via INTRAVENOUS

## 2022-02-09 MED ORDER — ROCURONIUM BROMIDE 10 MG/ML (PF) SYRINGE
PREFILLED_SYRINGE | INTRAVENOUS | Status: AC
  Filled 2022-02-09: qty 10

## 2022-02-09 MED ORDER — PROPOFOL 10 MG/ML IV BOLUS
INTRAVENOUS | Status: DC | PRN
  Administered 2022-02-09: 200 mg via INTRAVENOUS

## 2022-02-09 MED ORDER — BACITRACIN-NEOMYCIN-POLYMYXIN OINTMENT TUBE
TOPICAL_OINTMENT | CUTANEOUS | Status: AC
  Filled 2022-02-09: qty 14.17

## 2022-02-09 MED ORDER — 0.9 % SODIUM CHLORIDE (POUR BTL) OPTIME
TOPICAL | Status: DC | PRN
  Administered 2022-02-09: 1000 mL

## 2022-02-09 MED ORDER — LIDOCAINE 2% (20 MG/ML) 5 ML SYRINGE
INTRAMUSCULAR | Status: AC
  Filled 2022-02-09: qty 5

## 2022-02-09 MED ORDER — ONDANSETRON HCL 4 MG/2ML IJ SOLN
INTRAMUSCULAR | Status: AC
  Filled 2022-02-09: qty 4

## 2022-02-09 MED ORDER — LACTATED RINGERS IV SOLN: INTRAVENOUS | Status: DC

## 2022-02-09 MED ORDER — ACETAMINOPHEN 10 MG/ML IV SOLN
INTRAVENOUS | Status: DC | PRN
  Administered 2022-02-09: 1000 mg via INTRAVENOUS

## 2022-02-09 MED ORDER — ONDANSETRON HCL 4 MG/2ML IJ SOLN
INTRAMUSCULAR | Status: DC | PRN
  Administered 2022-02-09: 4 mg via INTRAVENOUS

## 2022-02-09 MED ORDER — DEXAMETHASONE SODIUM PHOSPHATE 10 MG/ML IJ SOLN
INTRAMUSCULAR | Status: AC
  Filled 2022-02-09: qty 1

## 2022-02-09 MED ORDER — ACETAMINOPHEN 10 MG/ML IV SOLN
INTRAVENOUS | Status: AC
  Filled 2022-02-09: qty 100

## 2022-02-09 MED ORDER — FENTANYL CITRATE (PF) 100 MCG/2ML IJ SOLN
INTRAMUSCULAR | Status: DC | PRN
Start: 2022-02-09 — End: 2022-02-09
  Administered 2022-02-09 (×3): 50 ug via INTRAVENOUS

## 2022-02-09 MED ORDER — DEXAMETHASONE SODIUM PHOSPHATE 10 MG/ML IJ SOLN
INTRAMUSCULAR | Status: AC
  Filled 2022-02-09: qty 2

## 2022-02-09 MED ORDER — DEXAMETHASONE SODIUM PHOSPHATE 4 MG/ML IJ SOLN
INTRAMUSCULAR | Status: DC | PRN
  Administered 2022-02-09: 10 mg via INTRAVENOUS

## 2022-02-09 MED ORDER — ONDANSETRON HCL 4 MG/2ML IJ SOLN
INTRAMUSCULAR | Status: AC
  Filled 2022-02-09: qty 2

## 2022-02-09 MED ORDER — BUPIVACAINE-EPINEPHRINE 0.25% -1:200000 IJ SOLN
INTRAMUSCULAR | Status: DC | PRN
  Administered 2022-02-09: 6 mL

## 2022-02-09 MED ORDER — CEFAZOLIN SODIUM-DEXTROSE 2-4 GM/100ML-% IV SOLN
INTRAVENOUS | Status: AC
  Filled 2022-02-09: qty 100

## 2022-02-09 MED ORDER — MIDAZOLAM HCL 2 MG/2ML IJ SOLN
INTRAMUSCULAR | Status: AC
  Filled 2022-02-09: qty 2

## 2022-02-09 MED ORDER — OXYCODONE HCL 5 MG PO TABS: 5.0000 mg | ORAL_TABLET | Freq: Once | ORAL | Status: DC | PRN

## 2022-02-09 MED ORDER — OXYCODONE HCL 5 MG/5ML PO SOLN: 5.0000 mg | Freq: Once | ORAL | Status: DC | PRN

## 2022-02-09 MED ORDER — PROPOFOL 500 MG/50ML IV EMUL
INTRAVENOUS | Status: AC
  Filled 2022-02-09: qty 50

## 2022-02-09 MED ORDER — KETOROLAC TROMETHAMINE 30 MG/ML IJ SOLN
INTRAMUSCULAR | Status: AC
  Filled 2022-02-09: qty 1

## 2022-02-09 MED ORDER — LIDOCAINE-EPINEPHRINE 1 %-1:100000 IJ SOLN
INTRAMUSCULAR | Status: AC
  Filled 2022-02-09: qty 1

## 2022-02-09 MED ORDER — MIDAZOLAM HCL 5 MG/5ML IJ SOLN
INTRAMUSCULAR | Status: DC | PRN
  Administered 2022-02-09: 2 mg via INTRAVENOUS

## 2022-02-09 MED ORDER — CEFAZOLIN SODIUM-DEXTROSE 2-4 GM/100ML-% IV SOLN
2.0000 g | INTRAVENOUS | Status: AC
  Administered 2022-02-09: 2 g via INTRAVENOUS

## 2022-02-09 MED ORDER — PROPOFOL 10 MG/ML IV BOLUS
INTRAVENOUS | Status: AC
Start: 2022-02-09 — End: ?
  Filled 2022-02-09: qty 20

## 2022-02-09 MED ORDER — DEXMEDETOMIDINE (PRECEDEX) IN NS 20 MCG/5ML (4 MCG/ML) IV SYRINGE
PREFILLED_SYRINGE | INTRAVENOUS | Status: DC | PRN
  Administered 2022-02-09 (×4): 4 ug via INTRAVENOUS

## 2022-02-09 MED ORDER — DEXMEDETOMIDINE (PRECEDEX) IN NS 20 MCG/5ML (4 MCG/ML) IV SYRINGE
PREFILLED_SYRINGE | INTRAVENOUS | Status: AC
  Filled 2022-02-09: qty 5

## 2022-02-09 SURGICAL SUPPLY — 28 items
BLADE SURG 15 STRL LF DISP TIS (BLADE) ×1 IMPLANT
BLADE SURG 15 STRL SS (BLADE) ×2
CANISTER SUCT 1200ML W/VALVE (MISCELLANEOUS) ×2 IMPLANT
COVER MAYO STAND STRL (DRAPES) ×1 IMPLANT
DRAPE UTILITY XL STRL (DRAPES) ×2 IMPLANT
ELECT COATED BLADE 2.86 ST (ELECTRODE) IMPLANT
ELECT REM PT RETURN 9FT ADLT (ELECTROSURGICAL)
ELECTRODE REM PT RTRN 9FT ADLT (ELECTROSURGICAL) IMPLANT
GLOVE BIO SURGEON STRL SZ7.5 (GLOVE) IMPLANT
GLOVE BIOGEL M STRL SZ7.5 (GLOVE) ×2 IMPLANT
GLOVE BIOGEL PI IND STRL 8 (GLOVE) ×1 IMPLANT
GLOVE BIOGEL PI INDICATOR 8 (GLOVE) ×1
GOWN STRL REUS W/ TWL LRG LVL3 (GOWN DISPOSABLE) ×2 IMPLANT
GOWN STRL REUS W/TWL LRG LVL3 (GOWN DISPOSABLE) ×4
MARKER SKIN DUAL TIP RULER LAB (MISCELLANEOUS) IMPLANT
NDL HYPO 27GX1-1/4 (NEEDLE) ×1 IMPLANT
NEEDLE HYPO 27GX1-1/4 (NEEDLE) ×2 IMPLANT
PACK BASIN DAY SURGERY FS (CUSTOM PROCEDURE TRAY) ×2 IMPLANT
PENCIL SMOKE EVACUATOR (MISCELLANEOUS) ×1 IMPLANT
SHEET MEDIUM DRAPE 40X70 STRL (DRAPES) ×2 IMPLANT
SPIKE FLUID TRANSFER (MISCELLANEOUS) ×2 IMPLANT
SUT CHROMIC 3 0 PS 2 (SUTURE) IMPLANT
SUT CHROMIC 4 0 PS 2 18 (SUTURE) IMPLANT
SYR CONTROL 10ML LL (SYRINGE) ×2 IMPLANT
TOWEL GREEN STERILE FF (TOWEL DISPOSABLE) ×2 IMPLANT
TRAY DSU PREP LF (CUSTOM PROCEDURE TRAY) IMPLANT
TUBE CONNECTING 20X1/4 (TUBING) ×2 IMPLANT
YANKAUER SUCT BULB TIP NO VENT (SUCTIONS) ×2 IMPLANT

## 2022-02-09 NOTE — Anesthesia Preprocedure Evaluation (Addendum)
Anesthesia Evaluation  Patient identified by MRN, date of birth, ID band Patient awake    Reviewed: Allergy & Precautions, NPO status , Patient's Chart, lab work & pertinent test results  Airway Mallampati: II  TM Distance: >3 FB Neck ROM: Full  Mouth opening: Limited Mouth Opening  Dental no notable dental hx. (+) Teeth Intact, Dental Advisory Given   Pulmonary asthma ,    Pulmonary exam normal breath sounds clear to auscultation       Cardiovascular negative cardio ROS Normal cardiovascular exam Rhythm:Regular Rate:Normal     Neuro/Psych  Headaches, Anxiety    GI/Hepatic Neg liver ROS,   Endo/Other    Renal/GU negative Renal ROS     Musculoskeletal   Abdominal   Peds  Hematology negative hematology ROS (+)   Anesthesia Other Findings   Reproductive/Obstetrics                            Anesthesia Physical  Anesthesia Plan  ASA: 2  Anesthesia Plan: General   Post-op Pain Management: Minimal or no pain anticipated and Ofirmev IV (intra-op)*   Induction: Intravenous  PONV Risk Score and Plan: 3 and Ondansetron, Dexamethasone, Midazolam and Treatment may vary due to age or medical condition  Airway Management Planned: Oral ETT and Video Laryngoscope Planned  Additional Equipment: None  Intra-op Plan:   Post-operative Plan: Extubation in OR  Informed Consent: I have reviewed the patients History and Physical, chart, labs and discussed the procedure including the risks, benefits and alternatives for the proposed anesthesia with the patient or authorized representative who has indicated his/her understanding and acceptance.     Dental advisory given  Plan Discussed with: CRNA  Anesthesia Plan Comments:        Anesthesia Quick Evaluation

## 2022-02-09 NOTE — Anesthesia Postprocedure Evaluation (Signed)
Anesthesia Post Note  Patient: Jon Mclaughlin  Procedure(s) Performed: REMOVAL OF STRYKER HYBRID ARCH BARS/MANDIBULAR HARDWARE (Left: Mouth)     Patient location during evaluation: PACU Anesthesia Type: General Level of consciousness: sedated and patient cooperative Pain management: pain level controlled Vital Signs Assessment: post-procedure vital signs reviewed and stable Respiratory status: spontaneous breathing Cardiovascular status: stable Anesthetic complications: no   No notable events documented.  Last Vitals:  Vitals:   02/09/22 1158 02/09/22 1219  BP: 108/67 122/64  Pulse: 71 63  Resp: 18 17  Temp:  36.8 C  SpO2: 98% 99%    Last Pain:  Vitals:   02/09/22 1219  TempSrc:   PainSc: 0-No pain                 Nolon Nations

## 2022-02-09 NOTE — Op Note (Signed)
Operative Note   DATE OF OPERATION: 02/09/2022  SURGICAL DEPARTMENT: Plastic Surgery  PREOPERATIVE DIAGNOSES:  mandible fracture  POSTOPERATIVE DIAGNOSES:  same  PROCEDURE:    Removal of Stryker Hybrid arch bars  SURGEON: Waylyn Tenbrink P. Sera Hitsman, MD  ASSISTANT: Evelena Leyden, PA.  ANESTHESIA:  General.   COMPLICATIONS: None.   INDICATIONS FOR PROCEDURE:  The patient, Jon Mclaughlin is a 21 y.o. male born on 02-11-2001, is here for treatment of mandible fracture. MRN: [id number]  CONSENT:  Informed consent was obtained directly from the patient. Risks, benefits and alternatives were fully discussed. Specific risks including but not limited to bleeding, infection, hematoma, seroma, scarring, pain, contracture, asymmetry, wound healing problems, and need for further surgery were all discussed. The patient did have an ample opportunity to have questions answered to satisfaction.   DESCRIPTION OF PROCEDURE:  The patient was taken to the operating room. SCDs were placed and preop antibiotics were given. General anesthesia was administered.  The patient's operative site was prepped and draped in a sterile fashion. A time out was performed and all information was confirmed to be correct.    The patient was injected with 0.25% Marcaine with epinephrine.  Bovie and freer elevator were used to expose the screws of the arch bar.  All screws were removed without difficulty along with the hybrid arch bar.  Arch bars and screws were passed off the field.  The advanced practice practitioner (APP) assisted throughout the case.  The APP was essential in retraction and counter traction when needed to make the case progress smoothly.  This retraction and assistance made it possible to see the tissue planes for the procedure.  The assistance was needed for hemostasis, tissue re-approximation and closure of the incision site.    The patient tolerated the procedure well.  There were no complications. The patient was  allowed to wake from anesthesia, extubated and taken to the recovery room in satisfactory condition.

## 2022-02-09 NOTE — Transfer of Care (Signed)
Immediate Anesthesia Transfer of Care Note  Patient: Jon Mclaughlin  Procedure(s) Performed: REMOVAL OF STRYKER HYBRID ARCH BARS/MANDIBULAR HARDWARE (Left: Mouth)  Patient Location: PACU  Anesthesia Type:General  Level of Consciousness: drowsy  Airway & Oxygen Therapy: Patient Spontanous Breathing and Patient connected to face mask oxygen  Post-op Assessment: Report given to RN and Post -op Vital signs reviewed and stable  Post vital signs: Reviewed and stable  Last Vitals:  Vitals Value Taken Time  BP 107/49 02/09/22 1125  Temp    Pulse 71 02/09/22 1129  Resp 13 02/09/22 1129  SpO2 97 % 02/09/22 1129  Vitals shown include unvalidated device data.  Last Pain:  Vitals:   02/09/22 0953  TempSrc: Oral  PainSc: 0-No pain         Complications: No notable events documented.

## 2022-02-09 NOTE — Discharge Instructions (Addendum)
Activity: As tolerated, but avoid strenuous activity for the next 1-2 weeks.  Diet: Liquid or soft diet for an additional week and then slowly advance as tolerated.    Special Instructions: You have small holes along gum line from where the arch bar screws were removed. Recommend that you swish with Listerine or similar mouthwash 4x daily for the next 1-2 weeks.  Do not smoke.  Good oral hygiene until wounds are completely healed.    Call our office if any unusual problems occur such as pain, excessive bleeding, unrelieved nausea/vomiting, fever &/or chills.  Pain control: Recommend ibuprofen 600 mg every 6 hours and Tylenol 500 mg every 6 hours.  Can alternate every 3 hours. I have also prescribed a short course of oxycodone for breakthrough pain. Do not take unless absolutely needed. Please note that it will make you drowsy and you cannot drive.  Follow up: As needed.  No Tylenol before 5:30pm 02-09-22   Post Anesthesia Home Care Instructions  Activity: Get plenty of rest for the remainder of the day. A responsible individual must stay with you for 24 hours following the procedure.  For the next 24 hours, DO NOT: -Drive a car -Paediatric nurse -Drink alcoholic beverages -Take any medication unless instructed by your physician -Make any legal decisions or sign important papers.  Meals: Start with liquid foods such as gelatin or soup. Progress to regular foods as tolerated. Avoid greasy, spicy, heavy foods. If nausea and/or vomiting occur, drink only clear liquids until the nausea and/or vomiting subsides. Call your physician if vomiting continues.  Special Instructions/Symptoms: Your throat may feel dry or sore from the anesthesia or the breathing tube placed in your throat during surgery. If this causes discomfort, gargle with warm salt water. The discomfort should disappear within 24 hours.  If you had a scopolamine patch placed behind your ear for the management of post-  operative nausea and/or vomiting:  1. The medication in the patch is effective for 72 hours, after which it should be removed.  Wrap patch in a tissue and discard in the trash. Wash hands thoroughly with soap and water. 2. You may remove the patch earlier than 72 hours if you experience unpleasant side effects which may include dry mouth, dizziness or visual disturbances. 3. Avoid touching the patch. Wash your hands with soap and water after contact with the patch.

## 2022-02-09 NOTE — Anesthesia Procedure Notes (Signed)
Procedure Name: Intubation Date/Time: 02/09/2022 10:51 AM Performed by: Alford Highland, CRNA Pre-anesthesia Checklist: Patient identified, Emergency Drugs available, Suction available and Patient being monitored Patient Re-evaluated:Patient Re-evaluated prior to induction Oxygen Delivery Method: Circle System Utilized Preoxygenation: Pre-oxygenation with 100% oxygen Induction Type: IV induction Ventilation: Mask ventilation without difficulty Laryngoscope Size: Glidescope and 3 Grade View: Grade I Tube type: Oral Tube size: 7.0 mm Number of attempts: 1 Airway Equipment and Method: Stylet and Oral airway Placement Confirmation: ETT inserted through vocal cords under direct vision, positive ETCO2 and breath sounds checked- equal and bilateral Secured at: 22 cm Tube secured with: Tape Dental Injury: Teeth and Oropharynx as per pre-operative assessment

## 2022-02-09 NOTE — H&P (Signed)
@  LOGO@   Referring Provider No referring provider defined for this encounter.   CC: No chief complaint on file.     Jon Mclaughlin is an 21 y.o. male.  HPI: 21 year old s/p mandible fracture presents for removal of hybrid arch bars.  No Known Allergies  @[employer] @   Past Medical History:  Diagnosis Date   Anxiety    Asthma    Migraine headache    Recurrent allergic croup     Past Surgical History:  Procedure Laterality Date   CLOSED REDUCTION MANDIBLE WITH MANDIBULOMA Left 12/30/2021   Procedure: CLOSED REDUCTION WITH PLACEMENT OF HYBRID MMF ARCH BARS;  Surgeon: 01/01/2022, MD;  Location: [facility];  Service: Plastics;  Laterality: Left;  1 hour    History reviewed. No pertinent family history.  Social History   Social History Narrative   Not on file     Review of Systems General: Denies fevers, chills, weight loss CV: Denies chest pain, shortness of breath, palpitations   Physical Exam    02/09/2022    9:53 AM 02/03/2022    1:40 PM 12/30/2021    1:45 PM  Vitals with BMI  Height 5\' 10"  5\' 10"    Weight 188 lbs 4 oz 189 lbs 10 oz   BMI 27.01 27.2   Systolic 131    Diastolic 78    Pulse 67  75    General:  No acute distress,  Alert and oriented, Non-Toxic, Normal speech and affect Heent:  hybrid arch bars in place Chest: clear CV: RRR Abd: soft Ext:  no c/c/e  Assessment/Plan Will remove hybrid arch bars in OR today.  01/01/2022 02/09/2022, 10:18 AM

## 2022-02-09 NOTE — Interval H&P Note (Signed)
History and Physical Interval Note:  02/09/2022 10:21 AM  Jon Mclaughlin  has presented today for surgery, with the diagnosis of Closed fracture of left mandibular angle with routine healing, subsequent encounter.  The various methods of treatment have been discussed with the patient and family. After consideration of risks, benefits and other options for treatment, the patient has consented to  Procedure(s) with comments: REMOVAL OF STRYKER HYBRID ARCH BARS/MANDIBULAR HARDWARE (Left) - 45 minutes as a surgical intervention.  The patient's history has been reviewed, patient examined, no change in status, stable for surgery.  I have reviewed the patient's chart and labs.  Questions were answered to the patient's satisfaction.     Janne Napoleon

## 2022-02-10 ENCOUNTER — Encounter (HOSPITAL_BASED_OUTPATIENT_CLINIC_OR_DEPARTMENT_OTHER): Payer: Self-pay | Admitting: Plastic Surgery

## 2022-02-10 NOTE — Progress Notes (Signed)
Left message stating courtesy call and if any questions or concerns please call the doctors office.
